# Patient Record
Sex: Male | Born: 1970
Health system: Southern US, Community
[De-identification: ages and names within clinical notes are randomized; demographics above are authoritative.]

## PROBLEM LIST (undated history)

## (undated) DIAGNOSIS — F419 Anxiety disorder, unspecified: Secondary | ICD-10-CM

## (undated) DIAGNOSIS — R112 Nausea with vomiting, unspecified: Secondary | ICD-10-CM

## (undated) DIAGNOSIS — I10 Essential (primary) hypertension: Secondary | ICD-10-CM

## (undated) DIAGNOSIS — Z9889 Other specified postprocedural states: Secondary | ICD-10-CM

## (undated) HISTORY — PX: SHOULDER ARTHROSCOPY: SHX128

## (undated) HISTORY — PX: ELBOW ARTHROSCOPY: SHX614

## (undated) HISTORY — PX: ESOPHAGUS SURGERY: SHX626

---

## 2006-12-08 ENCOUNTER — Ambulatory Visit (HOSPITAL_BASED_OUTPATIENT_CLINIC_OR_DEPARTMENT_OTHER): Admission: RE | Admit: 2006-12-08 | Discharge: 2006-12-08 | Payer: Self-pay | Admitting: Orthopedic Surgery

## 2006-12-09 ENCOUNTER — Emergency Department (HOSPITAL_COMMUNITY): Admission: EM | Admit: 2006-12-09 | Discharge: 2006-12-09 | Payer: Self-pay | Admitting: Emergency Medicine

## 2010-11-13 NOTE — Op Note (Signed)
[name], [name]               ACCOUNT NO.:  [id number]   MEDICAL RECORD NO.:  [id number]          PATIENT TYPE:  AMB   LOCATION:  [facility]                         FACILITY:  [facility]   PHYSICIAN:  Feliberto Gottron. Turner Daniels, M.D.   DATE OF BIRTH:  Apr 08, 1971   DATE OF PROCEDURE:  12/08/2006  DATE OF DISCHARGE:                               OPERATIVE REPORT   PREOPERATIVE DIAGNOSIS:  Right shoulder impingement syndrome with  acromioclavicular joint arthritis.   POSTOPERATIVE DIAGNOSES:  1. Right shoulder impingement syndrome with acromioclavicular joint      arthritis.  2. A loose body in the glenohumeral joint.  3. Mild chondromalacia of the glenoid.   PROCEDURES:  1. Right shoulder arthroscopic anterior-inferior acromioplasty.  2. Formal distal clavicle excision.  3. Debridement of chondromalacia from the anterior and inferior      glenoid.  4. Removal of cartilaginous loose body about 4 mm x 6 mm x 3 mm.   SURGEON:  Feliberto Gottron. Turner Daniels, M.D.   FIRST ASSISTANT:  Skip Mayer PA-C.   ANESTHETIC:  Interscalene block plus general endotracheal.   ESTIMATED BLOOD LOSS:  Minimal.   FLUID REPLACEMENT:  Crystalloid 100 mL.   DRAINS PLACED:  None.   TOURNIQUET TIME:  None.   INDICATIONS FOR PROCEDURE:  This is a 40 year old gentleman who was  treated by one of my partners, Eula Listen, for neck pain and then  right shoulder pain.  He got good temporary relief from a cortisone  injection of the subacromial space.  MRI scan is consistent with  moderate to severe AC joint arthritis as well as shoulder impingement.  Plain radiographs show a 8 mm type 2 subacromial spur.  He has failed  conservative measures and desires elective arthroscopic evaluation and  treatment of his right shoulder.   DESCRIPTION OF PROCEDURE:  The patient was taken to the block area at  the [facility] and underwent right shoulder  interscalene block.  He was then taken to the operating  room.  The  appropriate anesthetic monitors were reattached and general endotracheal  anesthesia induced.  He was then placed in the beach chair position.  The right upper extremity was prepped and draped in the usual sterile  fashion from the wrist to the hemithorax.   Using a #11 blade, standard portals were then made 1.5 cm anterior to  the [facility] joint and lateral to the junction of middle and posterior thirds  acromion and posterior to the posterolateral corner of the acromion  process.  The inflow was placed anteriorly, the arthroscope laterally  and a 4.2 [place] sucker shaver posteriorly, allowing a subacromial  bursectomy and evaluation of the external leaflets of the rotator cuff,  which were intact.  The subacromial spur was then outlined and removed  with a 4.5 hooded vortex bur as was the inferior distal 1 cm of the  clavicle, which had end-stage arthritic changes and bare bone on bare  bone.   We then changed portals, bringing the scope in posteriorly, the inflow  laterally and a 4.5 hooded vortex bur anteriorly, allowing  removal of  the superior one-half of the distal centimeter of clavicle.  Photographic documentation was made of the distal clavicle excision.  The arthroscope was then repositioned into the glenohumeral joint where  we medially identified a 4 x 6 x 3 mm loose body that was removed  through the outflow and found a donor site on the anterior-inferior  glenoid, consistent where the loose body came from.  This was debrided  with a 4.2 [place] sucker shaver back to stable margins and the  labrum was grossly intact.  The articular cartilage of the humeral head  and glenoid were, otherwise, in good condition.  The subscapularis,  supraspinatus and infraspinatus tendon insertions were intact.   At this point, the shoulder was irrigated out with normal saline  solution.  The arthroscopic instruments removed and dressing of  Xeroform, 4x4 dressing sponges,  ABD, paper tape and a sling were  applied.   The patient was then laid supine, awakened and taken to the recovery  room without difficulty.      Feliberto Gottron. Turner Daniels, M.D.  Electronically Signed     FJR/MEDQ  D:  12/08/2006  T:  12/08/2006  Job:  [id number]

## 2011-04-11 ENCOUNTER — Other Ambulatory Visit: Payer: Self-pay | Admitting: Internal Medicine

## 2011-04-11 ENCOUNTER — Ambulatory Visit
Admission: RE | Admit: 2011-04-11 | Discharge: 2011-04-11 | Disposition: A | Discharge status: Home / Self Care | Payer: BC Managed Care – PPO | Source: Ambulatory Visit | Attending: Internal Medicine | Admitting: Internal Medicine

## 2011-04-11 DIAGNOSIS — R52 Pain, unspecified: Secondary | ICD-10-CM

## 2011-04-18 LAB — DIFFERENTIAL
Basophils Relative: 1
Eosinophils Absolute: 0
Lymphs Abs: 2.6
Monocytes Relative: 4
Neutro Abs: 16.5 — ABNORMAL HIGH
Neutrophils Relative %: 83 — ABNORMAL HIGH

## 2011-04-18 LAB — CBC
MCV: 99.6
Platelets: 276
RBC: 4.65
WBC: 20.1 — ABNORMAL HIGH

## 2011-04-18 LAB — POCT HEMOGLOBIN-HEMACUE
Hemoglobin: 15.3
Operator id: 268271

## 2011-04-18 LAB — D-DIMER, QUANTITATIVE: D-Dimer, Quant: <0.22

## 2011-05-13 ENCOUNTER — Other Ambulatory Visit: Payer: Self-pay | Admitting: Orthopedic Surgery

## 2011-05-13 DIAGNOSIS — R531 Weakness: Secondary | ICD-10-CM

## 2011-05-13 DIAGNOSIS — M25521 Pain in right elbow: Secondary | ICD-10-CM

## 2011-05-16 ENCOUNTER — Ambulatory Visit
Admission: RE | Admit: 2011-05-16 | Discharge: 2011-05-16 | Disposition: A | Discharge status: Home / Self Care | Payer: Private Health Insurance - Indemnity | Source: Ambulatory Visit | Attending: Orthopedic Surgery | Admitting: Orthopedic Surgery

## 2011-05-16 DIAGNOSIS — M25521 Pain in right elbow: Secondary | ICD-10-CM

## 2011-05-16 DIAGNOSIS — R531 Weakness: Secondary | ICD-10-CM

## 2012-05-30 ENCOUNTER — Encounter (HOSPITAL_COMMUNITY): Payer: Self-pay | Admitting: Emergency Medicine

## 2012-05-30 ENCOUNTER — Emergency Department (HOSPITAL_COMMUNITY)
Admission: EM | Admit: 2012-05-30 | Discharge: 2012-05-31 | Disposition: A | Discharge status: Home / Self Care | Payer: Self-pay | Attending: Emergency Medicine | Admitting: Emergency Medicine

## 2012-05-30 DIAGNOSIS — Z23 Encounter for immunization: Secondary | ICD-10-CM | POA: Insufficient documentation

## 2012-05-30 DIAGNOSIS — F17200 Nicotine dependence, unspecified, uncomplicated: Secondary | ICD-10-CM | POA: Insufficient documentation

## 2012-05-30 DIAGNOSIS — W268XXA Contact with other sharp object(s), not elsewhere classified, initial encounter: Secondary | ICD-10-CM | POA: Insufficient documentation

## 2012-05-30 DIAGNOSIS — Y9289 Other specified places as the place of occurrence of the external cause: Secondary | ICD-10-CM | POA: Insufficient documentation

## 2012-05-30 DIAGNOSIS — S66829A Laceration of other specified muscles, fascia and tendons at wrist and hand level, unspecified hand, initial encounter: Secondary | ICD-10-CM

## 2012-05-30 DIAGNOSIS — S61409A Unspecified open wound of unspecified hand, initial encounter: Secondary | ICD-10-CM | POA: Insufficient documentation

## 2012-05-30 DIAGNOSIS — Y9389 Activity, other specified: Secondary | ICD-10-CM | POA: Insufficient documentation

## 2012-05-30 DIAGNOSIS — IMO0002 Reserved for concepts with insufficient information to code with codable children: Secondary | ICD-10-CM

## 2012-05-30 DIAGNOSIS — R200 Anesthesia of skin: Secondary | ICD-10-CM

## 2012-05-30 MED ORDER — OXYCODONE-ACETAMINOPHEN 5-325 MG PO TABS
2.0000 | ORAL_TABLET | Freq: Once | ORAL | Status: AC
  Administered 2012-05-30: 2 via ORAL
  Filled 2012-05-30: qty 2

## 2012-05-30 MED ORDER — TETANUS-DIPHTH-ACELL PERTUSSIS 5-2.5-18.5 LF-MCG/0.5 IM SUSP
0.5000 mL | Freq: Once | INTRAMUSCULAR | Status: AC
  Administered 2012-05-30: 0.5 mL via INTRAMUSCULAR
  Filled 2012-05-30: qty 0.5

## 2012-05-30 NOTE — ED Notes (Signed)
Pt. States he cut his wrist and hand on plate glass when he put his hand throw it.

## 2012-05-30 NOTE — ED Notes (Signed)
Suture cart at bedside 

## 2012-05-30 NOTE — ED Notes (Addendum)
Pt. Has a 1 inch laceration to his right hand below his fifth digit. Still has feeling in his finger but limited movement. When removed dressing began actively bleeding. Pressure dressing reapplied. Dressing removed from right wrist not currently bleeding. 1 inch deep laceration noted. Pressure dressing reapplied.

## 2012-05-30 NOTE — ED Provider Notes (Signed)
Medical screening examination/treatment/procedure(s) were conducted as a shared visit with non-physician practitioner(s) and myself.  I personally evaluated the patient during the encounter, right small finger loss 2Pt discrimination ulnar aspect with Pt able to flex FDP/FDS but weak possible flexor tendon(s) partial lac, has CR<2secs.  Hurman Horn, MD 06/01/12 [phone]

## 2012-05-31 MED ORDER — OXYCODONE-ACETAMINOPHEN 5-325 MG PO TABS: 1.0000 | ORAL_TABLET | ORAL | Status: DC | PRN

## 2012-05-31 MED ORDER — CEPHALEXIN 500 MG PO CAPS: 1000.0000 mg | ORAL_CAPSULE | Freq: Two times a day (BID) | ORAL | Status: DC

## 2012-05-31 NOTE — ED Notes (Signed)
Patient sitting at the sink washing his right hand

## 2012-05-31 NOTE — ED Notes (Signed)
Discharge instructions given to patient and family.  Voiced understanding

## 2012-05-31 NOTE — ED Notes (Signed)
Assisted patient to amb to the BR.  Became light headed when.he was getting ready to come back to the room.

## 2012-05-31 NOTE — ED Notes (Signed)
Suturing completed.

## 2012-05-31 NOTE — ED Provider Notes (Signed)
History     CSN: [id number]  Arrival date & time 05/30/12  2218   First MD Initiated Contact with Patient 05/30/12 2325      Chief Complaint  Patient presents with  . Extremity Laceration    (Consider location/radiation/quality/duration/timing/severity/associated sxs/prior treatment) The history is provided by the patient and medical records.   SUBJECTIVE:  41 y.o. male sustained laceration of hand and wrist 2 hours ago. Ashby Dawes of injury: patient was trying to catch a closing door when his hand punched through the glass pabe. Tetanus vaccination status reviewed: tetanus status unknown to the patient.  Symptoms began acutely, have been persistent and stabilized.  It is associated swelling, bleeding pain at the site.  Nothing makes the pain better, palpation makes the pain worse.  Patient denies fever, chills, headache, chest pain, shortness of breath, abdominal pain, nausea, vomiting, diarrhea, weakness, dizziness, numbness, tingling.  Patient states he has feeling in his hand except for his pinky finger and he is able to move it.  Patient endorses significant amount of EtOH today while watching football.  He denies falling, altered consciousness or hitting his head.     History reviewed. No pertinent past medical history.  Past Surgical History  Procedure Date  . Shoulder arthroscopy     right  . Elbow arthroscopy     right    No family history on file.  History  Substance Use Topics  . Smoking status: Current Every Day Smoker -- 1.0 packs/day    Types: Cigarettes  . Smokeless tobacco: Not on file  . Alcohol Use: Yes     Comment: weekends      Review of Systems  Constitutional: Negative for fever.  Skin: Positive for wound.  Neurological: Negative for weakness and numbness.  All other systems reviewed and are negative.    Allergies  Morphine and related  Home Medications   Current Outpatient Rx  Name  Route  Sig  Dispense  Refill  . IBUPROFEN 200 MG PO  TABS   Oral   Take 400 mg by mouth every 6 (six) hours as needed. For pain         . CEPHALEXIN 500 MG PO CAPS   Oral   Take 2 capsules (1,000 mg total) by mouth 2 (two) times daily.   20 capsule   0   . OXYCODONE-ACETAMINOPHEN 5-325 MG PO TABS   Oral   Take 1 tablet by mouth every 4 (four) hours as needed for pain.   20 tablet   0     BP 146/78  Pulse 101  Temp 97.7 F (36.5 C) (Oral)  Resp 16  SpO2 98%  Physical Exam  Nursing note and vitals reviewed. Constitutional: He appears well-developed and well-nourished. No distress.  HENT:  Head: Normocephalic and atraumatic.  Eyes: Conjunctivae normal are normal. No scleral icterus.  Cardiovascular: Normal rate, regular rhythm, normal heart sounds and intact distal pulses.  Exam reveals no gallop and no friction rub.   No murmur heard.      Capillary refill < 2 sec  Pulmonary/Chest: Effort normal and breath sounds normal. No respiratory distress. He has no wheezes. He has no rales.  Musculoskeletal: Normal range of motion. He exhibits no edema.       Right wrist: He exhibits laceration.       Arms:      Right hand: He exhibits laceration. decreased sensation noted. Decreased sensation is present in the ulnar distribution.  Hands:      Weak flexion of the little finger, extension of the finger intact Flexion/extension of the flexor and extensor tendons in the rest of the hand  Neurological: He is alert. GCS eye subscore is 4. GCS verbal subscore is 5. GCS motor subscore is 6.       right small finger with loss of two point discrimination along the ulnar aspect; two point discrimination present in all other fingers and in the radial aspect of the right small finger Right hand able to flex fingers but with weak flexion  Visible injury to the flexor tendon sheath of the right small finger  Skin: Skin is warm and dry. He is not diaphoretic.    ED Course  Procedures (including critical care time)  Labs Reviewed - No  data to display No results found.  LACERATION REPAIR Performed by: Dierdre Forth Authorized by: Dierdre Forth Consent: Verbal consent obtained. Risks and benefits: risks, benefits and alternatives were discussed Consent given by: patient Patient identity confirmed: provided demographic data Prepped and Draped in normal sterile fashion Wound explored  Laceration Location: r hand - laceration to the ulnar aspect of the palm, base of the small finger and base of the ring finger  Laceration Length: 2cm, 4cm and 2cm respectively  Paint flecks seen in the would  Anesthesia: local infiltration  Local anesthetic: lidocaine 2% without epinephrine  Anesthetic total: 10 ml  Irrigation method: syringe Amount of cleaning: >1 liter of fluid and manual foreign body removal  Skin closure: 4-0 prolene  Number of sutures: 15  Technique: simple interrupted  Patient tolerance: Patient tolerated the procedure well with no immediate complications.  LACERATION REPAIR Performed by: Dierdre Forth Authorized by: Dierdre Forth Consent: Verbal consent obtained. Risks and benefits: risks, benefits and alternatives were discussed Consent given by: patient Patient identity confirmed: provided demographic data Prepped and Draped in normal sterile fashion Wound explored  Laceration Location: right wrist  Laceration Length: 5 cm  No Foreign Bodies seen or palpated  Anesthesia: local infiltration  Local anesthetic: lidocaine 2 % without epinephrine  Anesthetic total: 10 ml  Irrigation method: syringe Amount of cleaning: standard  Skin closure: 3-0 prolene  Number of sutures: 8  Technique: interrupted mattress sutures  Patient tolerance: Patient tolerated the procedure well with no immediate complications.   1. Laceration   2. Flexor tendon laceration, hand, open wound   3. Loss of sensation of skin       MDM  [name] presents with  laceration to the R hand and wrist.  Tdap booster given.Pressure irrigation performed. Laceration occurred < 8 hours prior to repair which was well tolerated. Pt has no co morbidities to effect normal wound healing. Discussed suture home care w pt and answered questions. Pt to follow up with hand surgery for wound check and suture removal in 7 days. Pt is hemodynamically stable with no complaints prior to dc.  Hand and wrist splinted to prevent damage to the sutures.    1. Medications: percocet, keflex, usual home medications 2. Treatment: rest, drink plenty of fluids, take medications as prescribed, keep wound clean and dry as discussed 3. Follow Up: Please followup with your primary doctor for discussion of your diagnoses and further evaluation after today's visit; if you do not have a primary care doctor use the resource guide provided to find one; also follow-up with the hand surgeon          Dierdre Forth, PA-C 05/31/12 [id number]

## 2012-06-01 ENCOUNTER — Encounter (HOSPITAL_COMMUNITY): Payer: Self-pay | Admitting: *Deleted

## 2012-06-01 ENCOUNTER — Encounter (HOSPITAL_COMMUNITY): Payer: Self-pay | Admitting: Pharmacy Technician

## 2012-06-02 ENCOUNTER — Encounter (HOSPITAL_COMMUNITY): Payer: Self-pay | Admitting: Certified Registered Nurse Anesthetist

## 2012-06-02 ENCOUNTER — Ambulatory Visit (HOSPITAL_COMMUNITY)
Admission: RE | Admit: 2012-06-02 | Discharge: 2012-06-02 | Disposition: A | Discharge status: Home / Self Care | Payer: Self-pay | Source: Ambulatory Visit | Attending: General Surgery | Admitting: General Surgery

## 2012-06-02 ENCOUNTER — Encounter (HOSPITAL_COMMUNITY)
Admission: RE | Disposition: A | Discharge status: Home / Self Care | Payer: Self-pay | Source: Ambulatory Visit | Attending: General Surgery

## 2012-06-02 ENCOUNTER — Encounter (HOSPITAL_COMMUNITY): Payer: Self-pay | Admitting: *Deleted

## 2012-06-02 ENCOUNTER — Ambulatory Visit (HOSPITAL_COMMUNITY): Payer: Self-pay | Admitting: Certified Registered Nurse Anesthetist

## 2012-06-02 DIAGNOSIS — F17200 Nicotine dependence, unspecified, uncomplicated: Secondary | ICD-10-CM | POA: Insufficient documentation

## 2012-06-02 DIAGNOSIS — IMO0002 Reserved for concepts with insufficient information to code with codable children: Secondary | ICD-10-CM | POA: Insufficient documentation

## 2012-06-02 DIAGNOSIS — S61209A Unspecified open wound of unspecified finger without damage to nail, initial encounter: Secondary | ICD-10-CM | POA: Insufficient documentation

## 2012-06-02 DIAGNOSIS — W268XXA Contact with other sharp object(s), not elsewhere classified, initial encounter: Secondary | ICD-10-CM | POA: Insufficient documentation

## 2012-06-02 HISTORY — PX: TENDON REPAIR: SHX5111

## 2012-06-02 HISTORY — DX: Other specified postprocedural states: Z98.890

## 2012-06-02 HISTORY — DX: Other specified postprocedural states: R11.2

## 2012-06-02 LAB — COMPREHENSIVE METABOLIC PANEL
ALT: 42 U/L (ref 0–53)
Alkaline Phosphatase: 76 U/L (ref 39–117)
BUN: 12 mg/dL (ref 6–23)
CO2: 25 mEq/L (ref 19–32)
Calcium: 9.1 mg/dL (ref 8.4–10.5)
GFR calc Af Amer: >90 mL/min (ref >90)
GFR calc non Af Amer: >90 mL/min (ref >90)
Glucose, Bld: 91 mg/dL (ref 70–99)
Sodium: 136 mEq/L (ref 135–145)

## 2012-06-02 LAB — CBC
HCT: 45.4 % (ref 39.0–52.0)
Platelets: 247 10*3/uL (ref 150–400)
RBC: 4.74 MIL/uL (ref 4.22–5.81)
RDW: 12.3 % (ref 11.5–15.5)
WBC: 11.1 10*3/uL — ABNORMAL HIGH (ref 4.0–10.5)

## 2012-06-02 LAB — SURGICAL PCR SCREEN: Staphylococcus aureus: NEGATIVE

## 2012-06-02 SURGERY — TENDON REPAIR
Anesthesia: General | Site: Finger | Laterality: Right | Wound class: Clean

## 2012-06-02 MED ORDER — CEFAZOLIN SODIUM-DEXTROSE 2-3 GM-% IV SOLR
INTRAVENOUS | Status: AC
  Administered 2012-06-02: 2 g via INTRAVENOUS
  Filled 2012-06-02: qty 50

## 2012-06-02 MED ORDER — KETOROLAC TROMETHAMINE 30 MG/ML IJ SOLN
INTRAMUSCULAR | Status: AC
  Filled 2012-06-02: qty 1

## 2012-06-02 MED ORDER — PROPOFOL 10 MG/ML IV BOLUS
INTRAVENOUS | Status: DC | PRN
  Administered 2012-06-02: 200 mg via INTRAVENOUS

## 2012-06-02 MED ORDER — SUFENTANIL CITRATE 50 MCG/ML IV SOLN
INTRAVENOUS | Status: DC | PRN
  Administered 2012-06-02 (×2): 10 ug via INTRAVENOUS

## 2012-06-02 MED ORDER — BUPIVACAINE HCL (PF) 0.25 % IJ SOLN
INTRAMUSCULAR | Status: DC | PRN
  Administered 2012-06-02: 15 mL

## 2012-06-02 MED ORDER — MUPIROCIN 2 % EX OINT
TOPICAL_OINTMENT | Freq: Two times a day (BID) | CUTANEOUS | Status: DC
  Filled 2012-06-02: qty 22

## 2012-06-02 MED ORDER — FENTANYL CITRATE 0.05 MG/ML IJ SOLN
INTRAMUSCULAR | Status: AC
  Filled 2012-06-02: qty 2

## 2012-06-02 MED ORDER — SODIUM CHLORIDE 0.9 % IR SOLN
Status: DC | PRN
  Administered 2012-06-02: 1000 mL

## 2012-06-02 MED ORDER — MUPIROCIN 2 % EX OINT
TOPICAL_OINTMENT | CUTANEOUS | Status: AC
  Filled 2012-06-02: qty 22

## 2012-06-02 MED ORDER — DEXAMETHASONE SODIUM PHOSPHATE 4 MG/ML IJ SOLN
INTRAMUSCULAR | Status: DC | PRN
  Administered 2012-06-02: 4 mg via INTRAVENOUS

## 2012-06-02 MED ORDER — ONDANSETRON HCL 4 MG/2ML IJ SOLN
INTRAMUSCULAR | Status: DC | PRN
  Administered 2012-06-02: 4 mg via INTRAVENOUS

## 2012-06-02 MED ORDER — PROMETHAZINE HCL 25 MG/ML IJ SOLN: 6.2500 mg | INTRAMUSCULAR | Status: DC | PRN

## 2012-06-02 MED ORDER — MEPERIDINE HCL 25 MG/ML IJ SOLN: 6.2500 mg | INTRAMUSCULAR | Status: DC | PRN

## 2012-06-02 MED ORDER — FENTANYL CITRATE 0.05 MG/ML IJ SOLN
25.0000 ug | INTRAMUSCULAR | Status: DC | PRN
  Administered 2012-06-02: 50 ug via INTRAVENOUS

## 2012-06-02 MED ORDER — LACTATED RINGERS IV SOLN
INTRAVENOUS | Status: DC
  Administered 2012-06-02: 14:00:00 via INTRAVENOUS

## 2012-06-02 MED ORDER — BUPIVACAINE HCL (PF) 0.25 % IJ SOLN
INTRAMUSCULAR | Status: AC
  Filled 2012-06-02: qty 30

## 2012-06-02 MED ORDER — LACTATED RINGERS IV SOLN
INTRAVENOUS | Status: DC | PRN
  Administered 2012-06-02: 14:00:00 via INTRAVENOUS

## 2012-06-02 MED ORDER — CEFAZOLIN SODIUM-DEXTROSE 2-3 GM-% IV SOLR: 2.0000 g | INTRAVENOUS | Status: DC

## 2012-06-02 MED ORDER — MUPIROCIN 2 % EX OINT
TOPICAL_OINTMENT | CUTANEOUS | Status: AC
  Administered 2012-06-02: 1
  Filled 2012-06-02: qty 22

## 2012-06-02 MED ORDER — KETOROLAC TROMETHAMINE 30 MG/ML IJ SOLN
15.0000 mg | Freq: Once | INTRAMUSCULAR | Status: AC | PRN
  Administered 2012-06-02: 30 mg via INTRAVENOUS

## 2012-06-02 MED ORDER — SCOPOLAMINE 1 MG/3DAYS TD PT72
MEDICATED_PATCH | TRANSDERMAL | Status: AC
  Filled 2012-06-02: qty 1

## 2012-06-02 MED ORDER — CEFAZOLIN SODIUM-DEXTROSE 2-3 GM-% IV SOLR
INTRAVENOUS | Status: AC
  Filled 2012-06-02: qty 50

## 2012-06-02 MED ORDER — SCOPOLAMINE 1 MG/3DAYS TD PT72
1.0000 | MEDICATED_PATCH | Freq: Once | TRANSDERMAL | Status: DC
  Administered 2012-06-02: 1.5 mg via TRANSDERMAL

## 2012-06-02 MED ORDER — MIDAZOLAM HCL 5 MG/5ML IJ SOLN
INTRAMUSCULAR | Status: DC | PRN
  Administered 2012-06-02: 2 mg via INTRAVENOUS

## 2012-06-02 MED ORDER — MIDAZOLAM HCL 2 MG/2ML IJ SOLN: 0.5000 mg | Freq: Once | INTRAMUSCULAR | Status: DC | PRN

## 2012-06-02 MED ORDER — LIDOCAINE HCL (CARDIAC) 20 MG/ML IV SOLN
INTRAVENOUS | Status: DC | PRN
  Administered 2012-06-02: 40 mg via INTRAVENOUS
  Administered 2012-06-02: 60 mg via INTRAVENOUS

## 2012-06-02 SURGICAL SUPPLY — 48 items
BANDAGE ELASTIC 3 VELCRO ST LF (GAUZE/BANDAGES/DRESSINGS) IMPLANT
BANDAGE ELASTIC 4 VELCRO ST LF (GAUZE/BANDAGES/DRESSINGS) IMPLANT
BANDAGE GAUZE ELAST BULKY 4 IN (GAUZE/BANDAGES/DRESSINGS) IMPLANT
BNDG COHESIVE 1X5 TAN STRL LF (GAUZE/BANDAGES/DRESSINGS) IMPLANT
BNDG ELASTIC 2 VLCR STRL LF (GAUZE/BANDAGES/DRESSINGS) ×2 IMPLANT
CLOTH BEACON ORANGE TIMEOUT ST (SAFETY) ×2 IMPLANT
CONNECTOR NERVE AXOGARD 1.5X10 (Orthopedic Implant) ×2 IMPLANT
CORDS BIPOLAR (ELECTRODE) ×2 IMPLANT
COVER SURGICAL LIGHT HANDLE (MISCELLANEOUS) ×2 IMPLANT
CUFF TOURNIQUET SINGLE 18IN (TOURNIQUET CUFF) ×2 IMPLANT
CUFF TOURNIQUET SINGLE 24IN (TOURNIQUET CUFF) IMPLANT
DECANTER SPIKE VIAL GLASS SM (MISCELLANEOUS) ×2 IMPLANT
DRAPE OEC MINIVIEW 54X84 (DRAPES) IMPLANT
DRAPE SURG 17X23 STRL (DRAPES) ×2 IMPLANT
GAUZE SPONGE 2X2 8PLY STRL LF (GAUZE/BANDAGES/DRESSINGS) IMPLANT
GAUZE XEROFORM 1X8 LF (GAUZE/BANDAGES/DRESSINGS) ×2 IMPLANT
GLOVE BIOGEL M STRL SZ7.5 (GLOVE) ×2 IMPLANT
GOWN PREVENTION PLUS XLARGE (GOWN DISPOSABLE) ×2 IMPLANT
GOWN STRL NON-REIN LRG LVL3 (GOWN DISPOSABLE) ×4 IMPLANT
KIT BASIN OR (CUSTOM PROCEDURE TRAY) ×2 IMPLANT
KIT ROOM TURNOVER OR (KITS) ×2 IMPLANT
MANIFOLD NEPTUNE II (INSTRUMENTS) IMPLANT
NEEDLE HYPO 25GX1X1/2 BEV (NEEDLE) ×2 IMPLANT
NS IRRIG 1000ML POUR BTL (IV SOLUTION) ×2 IMPLANT
PACK ORTHO EXTREMITY (CUSTOM PROCEDURE TRAY) ×2 IMPLANT
PAD ARMBOARD 7.5X6 YLW CONV (MISCELLANEOUS) ×4 IMPLANT
PAD CAST 3X4 CTTN HI CHSV (CAST SUPPLIES) ×1 IMPLANT
PAD CAST 4YDX4 CTTN HI CHSV (CAST SUPPLIES) IMPLANT
PADDING CAST COTTON 3X4 STRL (CAST SUPPLIES) ×1
PADDING CAST COTTON 4X4 STRL (CAST SUPPLIES)
SOAP 2 % CHG 4 OZ (WOUND CARE) ×2 IMPLANT
SPECIMEN JAR SMALL (MISCELLANEOUS) ×2 IMPLANT
SPLINT FIBERGLASS 3X35 (CAST SUPPLIES) ×2 IMPLANT
SPONGE GAUZE 2X2 STER 10/PKG (GAUZE/BANDAGES/DRESSINGS)
SPONGE GAUZE 4X4 12PLY (GAUZE/BANDAGES/DRESSINGS) ×2 IMPLANT
SPONGE SCRUB IODOPHOR (GAUZE/BANDAGES/DRESSINGS) ×2 IMPLANT
SUCTION FRAZIER TIP 10 FR DISP (SUCTIONS) ×2 IMPLANT
SUT ETHILON 5 0 PS 2 18 (SUTURE) ×2 IMPLANT
SUT MERSILENE 4 0 P 3 (SUTURE) IMPLANT
SUT PROLENE 4 0 PS 2 18 (SUTURE) IMPLANT
SUT VIC AB 2-0 CT1 27 (SUTURE)
SUT VIC AB 2-0 CT1 TAPERPNT 27 (SUTURE) IMPLANT
SYR CONTROL 10ML LL (SYRINGE) ×2 IMPLANT
TOWEL OR 17X24 6PK STRL BLUE (TOWEL DISPOSABLE) ×2 IMPLANT
TOWEL OR 17X26 10 PK STRL BLUE (TOWEL DISPOSABLE) ×2 IMPLANT
TUBE CONNECTING 12X1/4 (SUCTIONS) ×2 IMPLANT
UNDERPAD 30X30 INCONTINENT (UNDERPADS AND DIAPERS) ×2 IMPLANT
WATER STERILE IRR 1000ML POUR (IV SOLUTION) IMPLANT

## 2012-06-02 NOTE — Transfer of Care (Signed)
Immediate Anesthesia Transfer of Care Note  Patient: [name]  Procedure(s) Performed: Procedure(s) (LRB) with comments: TENDON REPAIR (Right) - Nerve repair right pinky finger  Patient Location: PACU  Anesthesia Type:General  Level of Consciousness: awake, alert  and oriented  Airway & Oxygen Therapy: Patient Spontanous Breathing and Patient connected to nasal cannula oxygen  Post-op Assessment: Report given to PACU RN, Post -op Vital signs reviewed and stable and Patient moving all extremities  Post vital signs: Reviewed and stable  Complications: No apparent anesthesia complications

## 2012-06-02 NOTE — H&P (Signed)
S:pt here for operative repair of RSF, denies any new complaints/problems since he was evaluated in the office yesterday.  O:Blood pressure 148/87, pulse 101, temperature 97.8 F (36.6 C), temperature source Oral, resp. rate 20, height 6\' 4"  (1.93 m), weight 103.3 kg (227 lb 11.8 oz), SpO2 98.00%. No results found for this or any previous visit.   A:RSF complex laceration with probable digital nerve laceration   P:pt seen and examined, all questions answered, will proceed with surgery as planned.

## 2012-06-02 NOTE — Anesthesia Preprocedure Evaluation (Addendum)
Anesthesia Evaluation  Patient identified by MRN, date of birth, ID band Patient awake    Reviewed: Allergy & Precautions, H&P , Patient's Chart, lab work & pertinent test results, reviewed documented beta blocker date and time   History of Anesthesia Complications (+) PONV  Airway Mallampati: II TM Distance: >3 FB Neck ROM: full    Dental No notable dental hx. (+) Dental Advisory Given   Pulmonary neg pulmonary ROS, Current Smoker,  breath sounds clear to auscultation  Pulmonary exam normal       Cardiovascular Exercise Tolerance: Good negative cardio ROS  Rhythm:regular Rate:Normal     Neuro/Psych negative neurological ROS  negative psych ROS   GI/Hepatic negative GI ROS, Neg liver ROS,   Endo/Other  negative endocrine ROS  Renal/GU negative Renal ROS     Musculoskeletal   Abdominal   Peds  Hematology negative hematology ROS (+)   Anesthesia Other Findings   Reproductive/Obstetrics negative OB ROS                          Anesthesia Physical Anesthesia Plan  ASA: II  Anesthesia Plan: General LMA   Post-op Pain Management:    Induction: Intravenous  Airway Management Planned: LMA  Additional Equipment:   Intra-op Plan:   Post-operative Plan:   Informed Consent: I have reviewed the patients History and Physical, chart, labs and discussed the procedure including the risks, benefits and alternatives for the proposed anesthesia with the patient or authorized representative who has indicated his/her understanding and acceptance.   Dental Advisory Given and Dental advisory given  Plan Discussed with: CRNA, Surgeon and Anesthesiologist  Anesthesia Plan Comments:        Anesthesia Quick Evaluation

## 2012-06-02 NOTE — Progress Notes (Signed)
Call to Dr. Debby Bud office, clarified that [facility] stands for R small finger.

## 2012-06-02 NOTE — Anesthesia Postprocedure Evaluation (Signed)
Anesthesia Post Note  Patient: [name]  Procedure(s) Performed: Procedure(s) (LRB): TENDON REPAIR (Right)  Anesthesia type: GA  Patient location: PACU  Post pain: Pain level controlled  Post assessment: Post-op Vital signs reviewed  Last Vitals:  Filed Vitals:   06/02/12 1609  BP:   Pulse: 98  Temp:   Resp: 16    Post vital signs: Reviewed  Level of consciousness: sedated  Complications: No apparent anesthesia complications

## 2012-06-02 NOTE — Preoperative (Signed)
Beta Blockers   Reason not to administer Beta Blockers:Not Applicable 

## 2012-06-03 ENCOUNTER — Encounter (HOSPITAL_COMMUNITY): Payer: Self-pay | Admitting: General Surgery

## 2012-06-03 NOTE — Op Note (Signed)
[name], KREITZER NO.:  [id number]  MEDICAL RECORD NO.:  [id number]  LOCATION:  [facility]                         FACILITY:  [facility]  PHYSICIAN:  Johnette Abraham, MD    DATE OF BIRTH:  June 16, 1971  DATE OF PROCEDURE:  06/02/2012 DATE OF DISCHARGE:  06/02/2012                              OPERATIVE REPORT   PREOPERATIVE DIAGNOSIS:  Complex laceration to the right hand, right small finger with probable ulnar digital nerve laceration.  POSTOPERATIVE DIAGNOSIS:  Complex laceration to the right hand, right small finger with probable ulnar digital nerve laceration.  PROCEDURE:  Exploration of wound to the right small finger.  Repair of ulnar digital nerve, primary repair with a 2 mm x 10 mm nerve wrap.  ANESTHESIA:  General.  POSTOPERATIVE CONDITION:  Stable.  ESTIMATED BLOOD LOSS:  Minimal.  TOURNIQUET TIME:  Approximately 30 minutes.  INDICATIONS:  Mr. Savastano is a 41 year old gentleman who injured his hand on a plate glass this weekend and he presented to my office with signs and symptoms of ulnar digital nerve laceration.  He had multiple other lacerations about his ring finger and his wrist, however, neurovascularly he was intact with the exception of his small finger. Risks and benefits of alternatives of surgical repair of the nerve were thoroughly discussed with him.  He agreed with these risks and agreed to proceed with surgery.  Consent was obtained.  PROCEDURE:  The patient was taken to the operating room, placed supine on the operating room table.  Time-out was performed.  All extremities were padded.  Preoperative antibiotics were given.  General anesthesia was administered without difficulty.  The right upper extremity was prepped and draped in normal sterile fashion.  The arm was exsanguinated, Esmarch was used, tourniquet was inflated to 250 mmHg initially.  The sutures in the wound at the base of the right small finger were removed.  The wound  was then thoroughly irrigated with a liter of saline solution.  The incision had to be lengthened most proximally and distally to gain exposure.  The wound was explored. There was a laceration through the A2 pulley tendon sheath, however, both the FDP and FDS tendons were intact.  The radial neurovascular bundles were examined and intact.  With magnification, the ulnar digital nerve was isolated both proximally and distally and cleaned and freed. Both ends of the nerve were trimmed sharply with straight scissors.  A small piece of Esmarch was used underneath the nerve to gain adequate visualization of the epineurium.  The nerve was repaired in an epineural fashion with total of four 9-0 nylon sutures nicely approximating the nerve.  Following the nerve wrap that had been soaking in saline was cut in the middle and placed over the nerve.  It fit nicely and therefore no sutures were used to hold the wrap in place.  Afterwards the tourniquet was released.  Hemostasis was controlled with direct pressure and bipolar.  The skin edges were closed with multiple interrupted 5-0 nylon sutures.  More proximally there was a wound.  There was some concern for infection and it appeared that some kind of medical superglue had been placed over this abrasion type  puncture wound.  The superglue was removed.  The wound was debrided and then all wounds were covered with Xeroform ointment, sterile dressing, and a ulnar gutter splint was placed.  The patient tolerated the procedure well and was taken to recovery in stable condition.     Johnette Abraham, MD     HCC/MEDQ  D:  06/02/2012  T:  06/03/2012  Job:  [id number]

## 2014-11-13 ENCOUNTER — Encounter (HOSPITAL_COMMUNITY): Payer: Self-pay | Admitting: *Deleted

## 2014-11-13 ENCOUNTER — Emergency Department (HOSPITAL_COMMUNITY)
Admission: EM | Admit: 2014-11-13 | Discharge: 2014-11-13 | Disposition: A | Discharge status: Home / Self Care | Payer: Self-pay | Source: Home / Self Care | Attending: Family Medicine | Admitting: Family Medicine

## 2014-11-13 DIAGNOSIS — H6982 Other specified disorders of Eustachian tube, left ear: Secondary | ICD-10-CM

## 2014-11-13 MED ORDER — TRIAMCINOLONE ACETONIDE 40 MG/ML IJ SUSP
40.0000 mg | Freq: Once | INTRAMUSCULAR | Status: AC
  Administered 2014-11-13: 40 mg via INTRAMUSCULAR

## 2014-11-13 MED ORDER — IPRATROPIUM BROMIDE 0.06 % NA SOLN: 2.0000 | Freq: Four times a day (QID) | NASAL | Status: DC

## 2014-11-13 MED ORDER — TRIAMCINOLONE ACETONIDE 40 MG/ML IJ SUSP
INTRAMUSCULAR | Status: AC
  Filled 2014-11-13: qty 1

## 2014-11-13 NOTE — ED Notes (Signed)
Pt  Has  Symptoms of  Sinus  Drainage  /   Congestion        l earache  As  Well    Pt  Reports    symptoms  Not  releived  By otc meds  /   remedys          Pt  Ambulated  To  Room  With s   Steady  Fluid gait      Sitting upright on  The  Exam table  Speaking  In   complte  sentances

## 2014-11-13 NOTE — ED Provider Notes (Signed)
CSN: [id number]     Arrival date & time 11/13/14  1119 History   First MD Initiated Contact with Patient 11/13/14 1255     Chief Complaint  Patient presents with  . Otalgia   (Consider location/radiation/quality/duration/timing/severity/associated sxs/prior Treatment) Patient is a 44 y.o. male presenting with ear pain. The history is provided by the patient.  Otalgia Location:  Left Behind ear:  No abnormality Quality:  Sharp and pressure Severity:  No pain Onset quality:  Sudden Duration:  1 day Progression:  Unchanged Chronicity:  New Context comment:  Uri sx. Relieved by:  None tried Worsened by:  Nothing tried Ineffective treatments:  None tried Associated symptoms: congestion and rhinorrhea   Associated symptoms: no ear discharge, no fever and no headaches     Past Medical History  Diagnosis Date  . PONV (postoperative nausea and vomiting)    Past Surgical History  Procedure Laterality Date  . Shoulder arthroscopy      right  . Elbow arthroscopy      right  . Tendon repair  06/02/2012    Procedure: TENDON REPAIR;  Surgeon: Johnette AbrahamHarrill C Coley, MD;  Location: [facility] OR;  Service: Plastics;  Laterality: Right;  Nerve repair right pinky finger   History reviewed. No pertinent family history. History  Substance Use Topics  . Smoking status: Current Every Day Smoker -- 1.00 packs/day    Types: Cigarettes  . Smokeless tobacco: Not on file  . Alcohol Use: 7.2 oz/week    12 Cans of beer per week     Comment: weekends    Review of Systems  Constitutional: Negative.  Negative for fever.  HENT: Positive for congestion, ear pain and rhinorrhea. Negative for ear discharge.   Eyes: Negative.   Respiratory: Negative.   Neurological: Negative for headaches.    Allergies  Morphine and related  Home Medications   Prior to Admission medications   Medication Sig Start Date End Date Taking? Authorizing Provider  cephALEXin (KEFLEX) 500 MG capsule Take 2 capsules (1,000 mg  total) by mouth 2 (two) times daily. 05/31/12   Hannah Muthersbaugh, PA-C  ibuprofen (ADVIL,MOTRIN) 200 MG tablet Take 400 mg by mouth every 6 (six) hours as needed. For pain    Historical Provider, MD  ipratropium (ATROVENT) 0.06 % nasal spray Place 2 sprays into both nostrils 4 (four) times daily. 11/13/14   Linna HoffJames D Cornelious Bartolucci, MD  oxyCODONE-acetaminophen (PERCOCET) 5-325 MG per tablet Take 1 tablet by mouth every 4 (four) hours as needed for pain. 05/31/12   Hannah Muthersbaugh, PA-C   BP 127/84 mmHg  Pulse 71  Temp(Src) 97.5 F (36.4 C) (Oral)  Resp 16  SpO2 99% Physical Exam  Constitutional: He is oriented to person, place, and time. He appears well-developed and well-nourished.  HENT:  Head: Normocephalic.  Right Ear: External ear normal.  Left Ear: External ear normal.  Mouth/Throat: Oropharynx is clear and moist.  Eyes: Conjunctivae are normal. Pupils are equal, round, and reactive to light.  Neck: Normal range of motion. Neck supple.  Lymphadenopathy:    He has no cervical adenopathy.  Neurological: He is alert and oriented to person, place, and time.  Skin: Skin is warm and dry.  Nursing note and vitals reviewed.   ED Course  Procedures (including critical care time) Labs Review Labs Reviewed - No data to display  Imaging Review No results found.   MDM   1. ETD (eustachian tube dysfunction), left        Linna HoffJames D Desera Graffeo,  MD 11/13/14 1304

## 2014-11-13 NOTE — Discharge Instructions (Signed)
Use mucinex-d and spray as prescribed , esp before flying. Return as needed.

## 2015-01-03 ENCOUNTER — Emergency Department (HOSPITAL_COMMUNITY): Payer: Private Health Insurance - Indemnity

## 2015-01-03 ENCOUNTER — Emergency Department (HOSPITAL_COMMUNITY)
Admission: EM | Admit: 2015-01-03 | Discharge: 2015-01-03 | Disposition: A | Discharge status: Home / Self Care | Payer: Private Health Insurance - Indemnity | Attending: Emergency Medicine | Admitting: Emergency Medicine

## 2015-01-03 ENCOUNTER — Encounter (HOSPITAL_COMMUNITY): Payer: Self-pay

## 2015-01-03 DIAGNOSIS — Z720 Tobacco use: Secondary | ICD-10-CM | POA: Insufficient documentation

## 2015-01-03 DIAGNOSIS — K6289 Other specified diseases of anus and rectum: Secondary | ICD-10-CM | POA: Insufficient documentation

## 2015-01-03 LAB — BASIC METABOLIC PANEL
Anion gap: 7 (ref 5–15)
BUN: 14 mg/dL (ref 6–20)
CHLORIDE: 104 mmol/L (ref 101–111)
CO2: 27 mmol/L (ref 22–32)
Calcium: 8.8 mg/dL — ABNORMAL LOW (ref 8.9–10.3)
Creatinine, Ser: 0.9 mg/dL (ref 0.61–1.24)
GFR calc non Af Amer: >60 mL/min (ref >60)
GLUCOSE: 90 mg/dL (ref 65–99)
POTASSIUM: 4 mmol/L (ref 3.5–5.1)
Sodium: 138 mmol/L (ref 135–145)

## 2015-01-03 LAB — CBC WITH DIFFERENTIAL/PLATELET
BASOS ABS: 0 10*3/uL (ref 0.0–0.1)
BASOS PCT: 0 % (ref 0–1)
Eosinophils Absolute: 0 10*3/uL (ref 0.0–0.7)
Eosinophils Relative: 0 % (ref 0–5)
HCT: 45.9 % (ref 39.0–52.0)
Hemoglobin: 15.8 g/dL (ref 13.0–17.0)
LYMPHS PCT: 22 % (ref 12–46)
Lymphs Abs: 2.7 10*3/uL (ref 0.7–4.0)
MCH: 34.6 pg — ABNORMAL HIGH (ref 26.0–34.0)
MCHC: 34.4 g/dL (ref 30.0–36.0)
MCV: 100.4 fL — ABNORMAL HIGH (ref 78.0–100.0)
Monocytes Absolute: 0.8 10*3/uL (ref 0.1–1.0)
Monocytes Relative: 6 % (ref 3–12)
NEUTROS ABS: 8.6 10*3/uL — AB (ref 1.7–7.7)
NEUTROS PCT: 72 % (ref 43–77)
PLATELETS: 282 10*3/uL (ref 150–400)
RBC: 4.57 MIL/uL (ref 4.22–5.81)
RDW: 13.2 % (ref 11.5–15.5)
WBC: 12.1 10*3/uL — AB (ref 4.0–10.5)

## 2015-01-03 LAB — POC OCCULT BLOOD, ED: FECAL OCCULT BLD: NEGATIVE

## 2015-01-03 MED ORDER — OXYCODONE-ACETAMINOPHEN 5-325 MG PO TABS: 2.0000 | ORAL_TABLET | ORAL | Status: DC | PRN

## 2015-01-03 MED ORDER — IOHEXOL 300 MG/ML  SOLN
100.0000 mL | Freq: Once | INTRAMUSCULAR | Status: AC | PRN
  Administered 2015-01-03: 100 mL via INTRAVENOUS

## 2015-01-03 MED ORDER — CEPHALEXIN 500 MG PO CAPS: 500.0000 mg | ORAL_CAPSULE | Freq: Four times a day (QID) | ORAL | Status: DC

## 2015-01-03 MED ORDER — FENTANYL CITRATE (PF) 100 MCG/2ML IJ SOLN
25.0000 ug | Freq: Once | INTRAMUSCULAR | Status: AC
  Administered 2015-01-03: 25 ug via INTRAVENOUS
  Filled 2015-01-03: qty 2

## 2015-01-03 MED ORDER — LIDOCAINE HCL 2 % EX GEL: 1.0000 "application " | CUTANEOUS | Status: DC | PRN

## 2015-01-03 NOTE — ED Notes (Addendum)
Patient transported to CT 

## 2015-01-03 NOTE — ED Notes (Signed)
RN AT BEDSIDE FOR BLOOD 

## 2015-01-03 NOTE — ED Provider Notes (Signed)
CSN: [id number]     Arrival date & time 01/03/15  1349 History   First MD Initiated Contact with Patient 01/03/15 1539     Chief Complaint  Patient presents with  . Rectal Pain     (Consider location/radiation/quality/duration/timing/severity/associated sxs/prior Treatment) The history is provided by the patient. No language interpreter was used.  Mr. [name] is a 44 year old male who presents for rectal pain for the past 5-6 days. He states that it is painful to have a bowel movement and he has noticed tiny amounts of bright red blood after wiping with toilet paper. He denies any blood in the stool or in the toilet after bowel movement. He denies any pus or drainage from the rectum. He states he has been doing sitz baths to help but it has not alleviated the pain. It is worse when sleeping and laying flat on his back. He states he has never had anything like this before. She denies any fever, chills, abdominal pain, nausea, vomiting, diarrhea, constipation, dysuria, or hematuria. He denies a history of hemorrhoids or anal fissures.   Past Medical History  Diagnosis Date  . PONV (postoperative nausea and vomiting)    Past Surgical History  Procedure Laterality Date  . Shoulder arthroscopy      right  . Elbow arthroscopy      right  . Tendon repair  06/02/2012    Procedure: TENDON REPAIR;  Surgeon: Johnette AbrahamHarrill C Coley, MD;  Location: [facility] OR;  Service: Plastics;  Laterality: Right;  Nerve repair right pinky finger  . Esophagus surgery     History reviewed. No pertinent family history. History  Substance Use Topics  . Smoking status: Current Every Day Smoker -- 1.00 packs/day    Types: Cigarettes  . Smokeless tobacco: Not on file  . Alcohol Use: 7.2 oz/week    12 Cans of beer per week     Comment: weekends    Review of Systems  Constitutional: Negative for fever.  Gastrointestinal: Positive for rectal pain. Negative for abdominal pain and abdominal distention.  All other systems  reviewed and are negative.     Allergies  Morphine and related  Home Medications   Prior to Admission medications   Medication Sig Start Date End Date Taking? Authorizing Provider  ibuprofen (ADVIL,MOTRIN) 200 MG tablet Take 400 mg by mouth every 6 (six) hours as needed for headache, mild pain or moderate pain.    Yes Historical Provider, MD  cephALEXin (KEFLEX) 500 MG capsule Take 1 capsule (500 mg total) by mouth 4 (four) times daily. 01/03/15   Reola Buckles Patel-Mills, PA-C  ipratropium (ATROVENT) 0.06 % nasal spray Place 2 sprays into both nostrils 4 (four) times daily. Patient not taking: Reported on 01/03/2015 11/13/14   Linna HoffJames D Kindl, MD  lidocaine (XYLOCAINE) 2 % jelly Apply 1 application topically as needed. 01/03/15   Aniela Caniglia Patel-Mills, PA-C  oxyCODONE-acetaminophen (PERCOCET/ROXICET) 5-325 MG per tablet Take 2 tablets by mouth every 4 (four) hours as needed for severe pain. 01/03/15   Ramondo Dietze Patel-Mills, PA-C   BP 155/91 mmHg  Pulse 71  Temp(Src) 98 F (36.7 C) (Oral)  Resp 16  SpO2 99% Physical Exam  Constitutional: He is oriented to person, place, and time. He appears well-developed and well-nourished.  HENT:  Head: Normocephalic and atraumatic.  Eyes: Conjunctivae are normal.  Neck: Normal range of motion. Neck supple.  Cardiovascular: Normal rate, regular rhythm and normal heart sounds.   Pulmonary/Chest: Effort normal and breath sounds normal.  Abdominal: Soft. Normal  appearance. He exhibits no distension and no mass. There is no tenderness. There is no rigidity, no rebound and no guarding.  Genitourinary:  Rectal exam: Chaperone present. Perianal abscess at the 7o'clock position with tenderness.  He had no bogginess on rectal exam but was exquisitely tender. Brown stool. No blood. No external hemorrhoid or anal fissure.    Musculoskeletal: Normal range of motion.  Neurological: He is alert and oriented to person, place, and time.  Skin: Skin is warm and dry.  Psychiatric: He  has a normal mood and affect. His behavior is normal.  Nursing note and vitals reviewed.   ED Course  Procedures (including critical care time) Labs Review Labs Reviewed  CBC WITH DIFFERENTIAL/PLATELET - Abnormal; Notable for the following:    WBC 12.1 (*)    MCV 100.4 (*)    MCH 34.6 (*)    Neutro Abs 8.6 (*)    All other components within normal limits  BASIC METABOLIC PANEL - Abnormal; Notable for the following:    Calcium 8.8 (*)    All other components within normal limits  POC OCCULT BLOOD, ED    Imaging Review Ct Pelvis W Contrast  01/03/2015   CLINICAL DATA:  Pt c/o increasing rectal pain and inflammation x 6 days. Really intense over the last 24hours.  EXAM: CT PELVIS WITH CONTRAST  TECHNIQUE: Multidetector CT imaging of the pelvis was performed using the standard protocol following the bolus administration of intravenous contrast.  CONTRAST:  OMNIPAQUE IOHEXOL 300 MG/ML  SOLN  COMPARISON:  None.  FINDINGS: There is a small area of heterogeneous fluid attenuation measuring approximately 1 cm in greatest dimension, along the left margin of the anus with adjacent, inflammatory haziness. No discrete/defined abscess is seen. There is no evidence of infection above the levator muscles. Rectum is unremarkable as is the visualize colon. No pelvic masses or abnormal fluid collections. Mildly enlarged left external iliac chain lymph node is noted measuring 12 mm in short axis. No other adenopathy. Normal appendix is visualized.  No skeletal abnormality.  No evidence of osteomyelitis.  IMPRESSION: 1. Small ill-defined area low attenuation with adjacent mild inflammation lies along the left margin of the anus consistent with infection, but without a defined abscess. 2. No other acute finding. 3. Mild left external iliac chain adenopathy presumed reactive.   Electronically Signed   By: Amie Portland M.D.   On: 01/03/2015 18:09     EKG Interpretation None      MDM   Final diagnoses:   Perianal infection   Vitals stable and patient is afebrile. Labs are not concerning.  I obtained a CT pelvis with contrast which was negative for rectal abscess. There was a small area of inflammation along the left margin of the anus consistent with infection.  Meds: keflex, percocet,  lidocaine jelly.  I gave the patient return precautions such fever, increased swelling, no improvement in 48 hours.  Patient verbally agrees with the plan.      Catha Gosselin, PA-C 01/04/15 1610  Derwood Kaplan, MD 01/05/15 0157

## 2015-01-03 NOTE — ED Notes (Signed)
Pt c/o increasing rectal pain and inflammation x 6 days.  Pain score 8/10.  Pt sts "I think, I have a hemorrhoid."

## 2018-09-14 DIAGNOSIS — M79642 Pain in left hand: Secondary | ICD-10-CM | POA: Diagnosis not present

## 2018-09-14 DIAGNOSIS — M79641 Pain in right hand: Secondary | ICD-10-CM | POA: Diagnosis not present

## 2018-09-14 DIAGNOSIS — T23009A Burn of unspecified degree of unspecified hand, unspecified site, initial encounter: Secondary | ICD-10-CM | POA: Diagnosis not present

## 2019-12-13 DIAGNOSIS — R072 Precordial pain: Secondary | ICD-10-CM | POA: Diagnosis not present

## 2019-12-13 DIAGNOSIS — Z886 Allergy status to analgesic agent status: Secondary | ICD-10-CM | POA: Diagnosis not present

## 2019-12-13 DIAGNOSIS — R079 Chest pain, unspecified: Secondary | ICD-10-CM | POA: Diagnosis not present

## 2019-12-13 DIAGNOSIS — R0789 Other chest pain: Secondary | ICD-10-CM | POA: Diagnosis not present

## 2019-12-13 DIAGNOSIS — R0602 Shortness of breath: Secondary | ICD-10-CM | POA: Diagnosis not present

## 2019-12-13 DIAGNOSIS — Z79899 Other long term (current) drug therapy: Secondary | ICD-10-CM | POA: Diagnosis not present

## 2019-12-13 DIAGNOSIS — R61 Generalized hyperhidrosis: Secondary | ICD-10-CM | POA: Diagnosis not present

## 2019-12-13 DIAGNOSIS — F17200 Nicotine dependence, unspecified, uncomplicated: Secondary | ICD-10-CM | POA: Diagnosis not present

## 2020-04-28 DIAGNOSIS — I10 Essential (primary) hypertension: Secondary | ICD-10-CM | POA: Diagnosis not present

## 2020-04-28 DIAGNOSIS — F411 Generalized anxiety disorder: Secondary | ICD-10-CM | POA: Diagnosis not present

## 2020-05-19 DIAGNOSIS — K645 Perianal venous thrombosis: Secondary | ICD-10-CM | POA: Diagnosis not present

## 2020-06-09 DIAGNOSIS — R0781 Pleurodynia: Secondary | ICD-10-CM | POA: Diagnosis not present

## 2020-06-09 DIAGNOSIS — K603 Anal fistula: Secondary | ICD-10-CM | POA: Diagnosis not present

## 2020-06-09 DIAGNOSIS — F411 Generalized anxiety disorder: Secondary | ICD-10-CM | POA: Diagnosis not present

## 2020-09-07 DIAGNOSIS — H00021 Hordeolum internum right upper eyelid: Secondary | ICD-10-CM | POA: Diagnosis not present

## 2020-09-22 DIAGNOSIS — H10013 Acute follicular conjunctivitis, bilateral: Secondary | ICD-10-CM | POA: Diagnosis not present

## 2020-09-29 DIAGNOSIS — H00024 Hordeolum internum left upper eyelid: Secondary | ICD-10-CM | POA: Diagnosis not present

## 2020-10-04 DIAGNOSIS — H00022 Hordeolum internum right lower eyelid: Secondary | ICD-10-CM | POA: Diagnosis not present

## 2020-10-20 DIAGNOSIS — H10013 Acute follicular conjunctivitis, bilateral: Secondary | ICD-10-CM | POA: Diagnosis not present

## 2020-11-10 DIAGNOSIS — B079 Viral wart, unspecified: Secondary | ICD-10-CM | POA: Diagnosis not present

## 2020-11-10 DIAGNOSIS — I10 Essential (primary) hypertension: Secondary | ICD-10-CM | POA: Diagnosis not present

## 2020-11-10 DIAGNOSIS — Z0000 Encounter for general adult medical examination without abnormal findings: Secondary | ICD-10-CM | POA: Diagnosis not present

## 2020-11-10 DIAGNOSIS — Z13220 Encounter for screening for lipoid disorders: Secondary | ICD-10-CM | POA: Diagnosis not present

## 2021-01-22 ENCOUNTER — Emergency Department (INDEPENDENT_AMBULATORY_CARE_PROVIDER_SITE_OTHER): Payer: Federal, State, Local not specified - PPO

## 2021-01-22 ENCOUNTER — Emergency Department
Admission: EM | Admit: 2021-01-22 | Discharge: 2021-01-22 | Disposition: A | Discharge status: Home / Self Care | Payer: Federal, State, Local not specified - PPO | Source: Home / Self Care

## 2021-01-22 ENCOUNTER — Other Ambulatory Visit: Payer: Self-pay

## 2021-01-22 DIAGNOSIS — R0781 Pleurodynia: Secondary | ICD-10-CM

## 2021-01-22 DIAGNOSIS — W19XXXA Unspecified fall, initial encounter: Secondary | ICD-10-CM

## 2021-01-22 HISTORY — DX: Anxiety disorder, unspecified: F41.9

## 2021-01-22 HISTORY — DX: Essential (primary) hypertension: I10

## 2021-01-22 MED ORDER — ACETAMINOPHEN 325 MG PO TABS
650.0000 mg | ORAL_TABLET | Freq: Once | ORAL | Status: AC
  Administered 2021-01-22: 650 mg via ORAL

## 2021-01-22 MED ORDER — DICLOFENAC SODIUM 75 MG PO TBEC: 75.0000 mg | DELAYED_RELEASE_TABLET | Freq: Two times a day (BID) | ORAL | 0 refills | Status: AC

## 2021-01-22 NOTE — Discharge Instructions (Addendum)
Advised patient to take medication as directed with food to completion.  Note provided per patient's request

## 2021-01-22 NOTE — ED Provider Notes (Signed)
[facility]    CSN: [id number] Arrival date & time: 01/22/21  1536      History   Chief Complaint Chief Complaint  Patient presents with   Fall    RT Rib pain    HPI [name] is a 50 y.o. male.   HPI 50 year old male presents with right-sided rib pain after fall in shower on Saturday, 01/20/2021.  Patient denies hitting head or LOC.  Reports alternating Tylenol and ibuprofen for pain reports taking Tylenol at 7 AM and ibuprofen at noon.  Past Medical History:  Diagnosis Date   Anxiety    Hypertension    PONV (postoperative nausea and vomiting)     There are no problems to display for this patient.   Past Surgical History:  Procedure Laterality Date   ELBOW ARTHROSCOPY     right   ESOPHAGUS SURGERY     SHOULDER ARTHROSCOPY     right   TENDON REPAIR  06/02/2012   Procedure: TENDON REPAIR;  Surgeon: Johnette Abraham, MD;  Location: [facility] OR;  Service: Plastics;  Laterality: Right;  Nerve repair right pinky finger       Home Medications    Prior to Admission medications   Medication Sig Start Date End Date Taking? Authorizing Provider  acetaminophen (TYLENOL) 500 MG tablet Take 500 mg by mouth every 6 (six) hours as needed.   Yes [provider]  citalopram (CELEXA) 20 MG tablet Take 20 mg by mouth daily.   Yes [provider]  diclofenac (VOLTAREN) 75 MG EC tablet Take 1 tablet (75 mg total) by mouth 2 (two) times daily for 10 days. 01/22/21 02/01/21 Yes Trevor Iha, FNP  lisinopril (ZESTRIL) 10 MG tablet Take 10 mg by mouth daily.   Yes [provider]  cephALEXin (KEFLEX) 500 MG capsule Take 1 capsule (500 mg total) by mouth 4 (four) times daily. Patient not taking: Reported on 01/22/2021 01/03/15   Patel-Mills, Lorelle Formosa, PA-C  ibuprofen (ADVIL,MOTRIN) 200 MG tablet Take 400 mg by mouth every 6 (six) hours as needed for headache, mild pain or moderate pain.     [provider]  ipratropium (ATROVENT) 0.06 % nasal spray  Place 2 sprays into both nostrils 4 (four) times daily. Patient not taking: Reported on 01/03/2015 11/13/14   Linna Hoff, MD  lidocaine (XYLOCAINE) 2 % jelly Apply 1 application topically as needed. Patient not taking: Reported on 01/22/2021 01/03/15   Patel-Mills, Lorelle Formosa, PA-C  oxyCODONE-acetaminophen (PERCOCET/ROXICET) 5-325 MG per tablet Take 2 tablets by mouth every 4 (four) hours as needed for severe pain. Patient not taking: Reported on 01/22/2021 01/03/15   Catha Gosselin, PA-C    Family History Family History  Problem Relation Age of Onset   Healthy Mother    Heart disease Father     Social History Social History   Tobacco Use   Smoking status: Every Day    Packs/day: 1.00    Types: Cigarettes   Smokeless tobacco: Never  Vaping Use   Vaping Use: Never used  Substance Use Topics   Alcohol use: Yes    Alcohol/week: 12.0 standard drinks    Types: 12 Cans of beer per week    Comment: weekends   Drug use: No     Allergies   Morphine and related   Review of Systems Review of Systems  Musculoskeletal:        Right sided posterior rib cage pain x 2 days    Physical Exam Triage Vital  Signs ED Triage Vitals  Enc Vitals Group     BP 01/22/21 1550 (!) 150/84     Pulse Rate 01/22/21 1550 83     Resp 01/22/21 1550 14     Temp 01/22/21 1550 98 F (36.7 C)     Temp Source 01/22/21 1550 Oral     SpO2 01/22/21 1550 97 %     Weight 01/22/21 1551 192 lb (87.1 kg)     Height 01/22/21 1551 6\' 3"  (1.905 m)     Head Circumference --      Peak Flow --      Pain Score 01/22/21 1551 9     Pain Loc --      Pain Edu? --      Excl. in GC? --    No data found.  Updated Vital Signs BP (!) 150/84 (BP Location: Left Arm)   Pulse 83   Temp 98 F (36.7 C) (Oral)   Resp 14   Ht 6\' 3"  (1.905 m)   Wt 192 lb (87.1 kg)   SpO2 97%   BMI 24.00 kg/m   Physical Exam Vitals and nursing note reviewed.  Constitutional:      General: He is in acute distress.     Appearance:  Normal appearance. He is normal weight. He is ill-appearing.  HENT:     Head: Normocephalic and atraumatic.     Mouth/Throat:     Mouth: Mucous membranes are moist.     Pharynx: Oropharynx is clear.  Eyes:     Extraocular Movements: Extraocular movements intact.     Conjunctiva/sclera: Conjunctivae normal.     Pupils: Pupils are equal, round, and reactive to light.  Cardiovascular:     Rate and Rhythm: Normal rate and regular rhythm.     Pulses: Normal pulses.     Heart sounds: Normal heart sounds.  Pulmonary:     Effort: Pulmonary effort is normal. No respiratory distress.     Breath sounds: Normal breath sounds. No wheezing, rhonchi or rales.  Musculoskeletal:        General: Normal range of motion.     Cervical back: Normal range of motion and neck supple. No tenderness.     Comments: Rib cage (right-sided posterior inferior aspect): TTP over fifth through eighth rib cage, no deformity noted  Lymphadenopathy:     Cervical: No cervical adenopathy.  Neurological:     General: No focal deficit present.     Mental Status: He is alert and oriented to person, place, and time.  Psychiatric:        Mood and Affect: Mood normal.        Behavior: Behavior normal.        Thought Content: Thought content normal.     UC Treatments / Results  Labs (all labs ordered are listed, but only abnormal results are displayed) Labs Reviewed - No data to display  EKG   Radiology DG Chest 2 View  Result Date: 01/22/2021 CLINICAL DATA:  Fall, rib pain EXAM: CHEST - 2 VIEW COMPARISON:  12/09/2006 FINDINGS: The heart size and mediastinal contours are within normal limits. Both lungs are clear. The visualized skeletal structures are unremarkable. IMPRESSION: No acute abnormality of the lungs. Electronically Signed   By: 01/24/2021 M.D.   On: 01/22/2021 17:14    Procedures Procedures (including critical care time)  Medications Ordered in UC Medications  acetaminophen (TYLENOL) tablet 650 mg  (650 mg Oral Given 01/22/21 1557)    Initial  Impression / Assessment and Plan / UC Course  I have reviewed the triage vital signs and the nursing notes.  Pertinent labs & imaging results that were available during my care of the patient were reviewed by me and considered in my medical decision making (see chart for details).    MDM: 1.  Fall initial encounter-patient denies hitting head or LOC, 2.  Rib pain-CXR reveals no fracture, Rx'd diclofenac 75 mg twice daily x 10 days.  Work note provided to patient prior to discharge. Final Clinical Impressions(s) / UC Diagnoses   Final diagnoses:  Fall, initial encounter  Rib pain on right side     Discharge Instructions      Advised patient to take medication as directed with food to completion.     ED Prescriptions     Medication Sig Dispense Auth. Provider   diclofenac (VOLTAREN) 75 MG EC tablet Take 1 tablet (75 mg total) by mouth 2 (two) times daily for 10 days. 20 tablet Trevor Iha, FNP      PDMP not reviewed this encounter.   Trevor Iha, FNP 01/22/21 1746

## 2021-01-22 NOTE — ED Triage Notes (Signed)
Pt seen in UC w/ c/o RT side rib pain after a fall in the shower on Saturday. Pt denies hitting head or LOC. Pt endorses alternating tylenol and ibuprofen for pain. Last tylenol was this AM around 0700 and ibuprofen at 1200. Tylenol offered for severe pain.

## 2021-04-02 ENCOUNTER — Other Ambulatory Visit: Payer: Self-pay

## 2021-04-02 ENCOUNTER — Encounter: Payer: Self-pay | Admitting: Emergency Medicine

## 2021-04-02 ENCOUNTER — Ambulatory Visit
Admission: EM | Admit: 2021-04-02 | Discharge: 2021-04-02 | Disposition: A | Discharge status: Home / Self Care | Payer: Federal, State, Local not specified - PPO | Attending: Internal Medicine | Admitting: Internal Medicine

## 2021-04-02 DIAGNOSIS — Z20822 Contact with and (suspected) exposure to covid-19: Secondary | ICD-10-CM | POA: Insufficient documentation

## 2021-04-02 DIAGNOSIS — J029 Acute pharyngitis, unspecified: Secondary | ICD-10-CM | POA: Diagnosis not present

## 2021-04-02 DIAGNOSIS — R590 Localized enlarged lymph nodes: Secondary | ICD-10-CM | POA: Diagnosis not present

## 2021-04-02 LAB — POCT RAPID STREP A (OFFICE): Rapid Strep A Screen: NEGATIVE

## 2021-04-02 MED ORDER — AMOXICILLIN-POT CLAVULANATE 875-125 MG PO TABS: 1.0000 | ORAL_TABLET | Freq: Two times a day (BID) | ORAL | 0 refills | Status: DC

## 2021-04-02 NOTE — Discharge Instructions (Signed)
Your rapid strep test was negative, but still suspicious of strep throat given your symptoms.  Will treat with Augmentin antibiotic.  Throat culture and COVID-19 viral swab are pending.  We will call if these are positive.  Please go to the hospital if no improvement in symptoms or if they worsen in the next 48 hours.

## 2021-04-02 NOTE — ED Triage Notes (Signed)
Sore throat x 2 days. Swelling visible under chin, at base of throat. Temp 100.8 at work. Took tylenol before coming today.

## 2021-04-02 NOTE — ED Provider Notes (Signed)
[facility]    CSN: [id number] Arrival date & time: 04/02/21  0836      History   Chief Complaint Chief Complaint  Patient presents with   Sore Throat    HPI [name] is a 50 y.o. male.   Patient presents with sore throat and lymph node swelling to the neck that has been present for 2 days.  Patient denies any upper respiratory symptoms, cough, sick contacts.  Patient did have temp max of 100.8 this morning.  Has taken Tylenol for fever and pain.  Denies any shortness of breath, feelings of throat swelling, difficulty swallowing, chest pain.  Patient is able to swallow secretions without any issues.   Sore Throat   Past Medical History:  Diagnosis Date   Anxiety    Hypertension    PONV (postoperative nausea and vomiting)     There are no problems to display for this patient.   Past Surgical History:  Procedure Laterality Date   ELBOW ARTHROSCOPY     right   ESOPHAGUS SURGERY     SHOULDER ARTHROSCOPY     right   TENDON REPAIR  06/02/2012   Procedure: TENDON REPAIR;  Surgeon: Johnette Abraham, MD;  Location: [facility] OR;  Service: Plastics;  Laterality: Right;  Nerve repair right pinky finger       Home Medications    Prior to Admission medications   Medication Sig Start Date End Date Taking? Authorizing Provider  amoxicillin-clavulanate (AUGMENTIN) 875-125 MG tablet Take 1 tablet by mouth every 12 (twelve) hours. 04/02/21  Yes Lance Muss, FNP  acetaminophen (TYLENOL) 500 MG tablet Take 500 mg by mouth every 6 (six) hours as needed.    [provider]  citalopram (CELEXA) 20 MG tablet Take 20 mg by mouth daily.    [provider]  ibuprofen (ADVIL,MOTRIN) 200 MG tablet Take 400 mg by mouth every 6 (six) hours as needed for headache, mild pain or moderate pain.     [provider]  ipratropium (ATROVENT) 0.06 % nasal spray Place 2 sprays into both nostrils 4 (four) times daily. Patient not taking: Reported on 01/03/2015  11/13/14   Linna Hoff, MD  lidocaine (XYLOCAINE) 2 % jelly Apply 1 application topically as needed. Patient not taking: Reported on 01/22/2021 01/03/15   Patel-Mills, Lorelle Formosa, PA-C  lisinopril (ZESTRIL) 10 MG tablet Take 10 mg by mouth daily.    [provider]  oxyCODONE-acetaminophen (PERCOCET/ROXICET) 5-325 MG per tablet Take 2 tablets by mouth every 4 (four) hours as needed for severe pain. Patient not taking: Reported on 01/22/2021 01/03/15   Catha Gosselin, PA-C    Family History Family History  Problem Relation Age of Onset   Healthy Mother    Heart disease Father     Social History Social History   Tobacco Use   Smoking status: Every Day    Packs/day: 1.00    Types: Cigarettes   Smokeless tobacco: Never  Vaping Use   Vaping Use: Never used  Substance Use Topics   Alcohol use: Yes    Alcohol/week: 12.0 standard drinks    Types: 12 Cans of beer per week    Comment: weekends   Drug use: No     Allergies   Morphine and related   Review of Systems Review of Systems Per HPI  Physical Exam Triage Vital Signs ED Triage Vitals  Enc Vitals Group     BP 04/02/21 0915 122/80     Pulse Rate 04/02/21  0915 86     Resp 04/02/21 0915 16     Temp 04/02/21 0915 99.4 F (37.4 C)     Temp Source 04/02/21 0915 Oral     SpO2 04/02/21 0915 97 %     Weight --      Height --      Head Circumference --      Peak Flow --      Pain Score 04/02/21 0916 6     Pain Loc --      Pain Edu? --      Excl. in GC? --    No data found.  Updated Vital Signs BP 122/80 (BP Location: Right Arm)   Pulse 86   Temp 99.4 F (37.4 C) (Oral)   Resp 16   SpO2 97%   Visual Acuity Right Eye Distance:   Left Eye Distance:   Bilateral Distance:    Right Eye Near:   Left Eye Near:    Bilateral Near:     Physical Exam Constitutional:      General: He is not in acute distress.    Appearance: Normal appearance. He is not toxic-appearing or diaphoretic.  HENT:     Head:  Normocephalic and atraumatic.     Right Ear: Tympanic membrane and ear canal normal.     Left Ear: Tympanic membrane and ear canal normal.     Nose: Nose normal.     Mouth/Throat:     Mouth: Mucous membranes are moist.     Pharynx: Posterior oropharyngeal erythema present. No oropharyngeal exudate.  Eyes:     Extraocular Movements: Extraocular movements intact.     Conjunctiva/sclera: Conjunctivae normal.  Cardiovascular:     Rate and Rhythm: Normal rate and regular rhythm.     Pulses: Normal pulses.     Heart sounds: Normal heart sounds.  Pulmonary:     Effort: Pulmonary effort is normal. No respiratory distress.     Breath sounds: Normal breath sounds. No stridor. No wheezing, rhonchi or rales.  Lymphadenopathy:     Cervical: Cervical adenopathy present.     Left cervical: Superficial cervical adenopathy present.  Skin:    General: Skin is warm and dry.  Neurological:     General: No focal deficit present.     Mental Status: He is alert and oriented to person, place, and time. Mental status is at baseline.  Psychiatric:        Mood and Affect: Mood normal.        Behavior: Behavior normal.        Thought Content: Thought content normal.        Judgment: Judgment normal.     UC Treatments / Results  Labs (all labs ordered are listed, but only abnormal results are displayed) Labs Reviewed  CULTURE, GROUP A STREP ([facility])  NOVEL CORONAVIRUS, NAA  POCT RAPID STREP A (OFFICE)    EKG   Radiology No results found.  Procedures Procedures (including critical care time)  Medications Ordered in UC Medications - No data to display  Initial Impression / Assessment and Plan / UC Course  I have reviewed the triage vital signs and the nursing notes.  Pertinent labs & imaging results that were available during my care of the patient were reviewed by me and considered in my medical decision making (see chart for details).     Rapid strep test was negative but still  suspicious of strep throat given patient's physical exam and posterior pharynx.  Patient  also has cervical lymphadenopathy which indicates necessity for treatment with antibiotic therapy.  Will treat with Augmentin x7 days.  Advised patient to go to the hospital if no improvement in symptoms in the next 48 hours or if they worsen.  Patient is nontoxic-appearing and does not appear to be in need of immediate medical attention at the hospital at this time.  Throat culture and COVID-19 viral swab are pending.  No signs of peritonsillar abscess on exam.  Fever monitoring and management discussed with patient.Discussed strict return precautions. Patient verbalized understanding and is agreeable with plan.  Final Clinical Impressions(s) / UC Diagnoses   Final diagnoses:  Sore throat  Cervical lymphadenopathy  Encounter for laboratory testing for COVID-19 virus  Encounter for screening laboratory testing for COVID-19 virus     Discharge Instructions      Your rapid strep test was negative, but still suspicious of strep throat given your symptoms.  Will treat with Augmentin antibiotic.  Throat culture and COVID-19 viral swab are pending.  We will call if these are positive.  Please go to the hospital if no improvement in symptoms or if they worsen in the next 48 hours.     ED Prescriptions     Medication Sig Dispense Auth. Provider   amoxicillin-clavulanate (AUGMENTIN) 875-125 MG tablet Take 1 tablet by mouth every 12 (twelve) hours. 14 tablet Lance Muss, FNP      PDMP not reviewed this encounter.   Lance Muss, FNP 04/02/21 1019

## 2021-04-03 LAB — SARS-COV-2, NAA 2 DAY TAT

## 2021-04-03 LAB — NOVEL CORONAVIRUS, NAA: SARS-CoV-2, NAA: NOT DETECTED

## 2021-04-04 LAB — CULTURE, GROUP A STREP (THRC)

## 2021-04-11 ENCOUNTER — Emergency Department
Admission: RE | Admit: 2021-04-11 | Discharge: 2021-04-11 | Disposition: A | Discharge status: Home / Self Care | Payer: Federal, State, Local not specified - PPO | Source: Ambulatory Visit

## 2021-04-11 ENCOUNTER — Other Ambulatory Visit: Payer: Self-pay

## 2021-04-11 VITALS — BP 151/94 | HR 71 | Temp 98.4°F | Resp 15 | Ht 75.0 in | Wt 186.0 lb

## 2021-04-11 DIAGNOSIS — L237 Allergic contact dermatitis due to plants, except food: Secondary | ICD-10-CM | POA: Diagnosis not present

## 2021-04-11 MED ORDER — METHYLPREDNISOLONE SODIUM SUCC 125 MG IJ SOLR
125.0000 mg | Freq: Once | INTRAMUSCULAR | Status: AC
  Administered 2021-04-11: 125 mg via INTRAMUSCULAR

## 2021-04-11 MED ORDER — PREDNISONE 20 MG PO TABS: ORAL_TABLET | ORAL | 0 refills | Status: DC

## 2021-04-11 NOTE — ED Provider Notes (Signed)
[name]    CSN: [id number] Arrival date & time: 04/11/21  1533      History   Chief Complaint Chief Complaint  Patient presents with   Poison Ivy    HPI [name] is a 50 y.o. male.   HPI 50 year old patient presents with poison ivy bilateral upper/lower arms.  Past Medical History:  Diagnosis Date   Anxiety    Hypertension    PONV (postoperative nausea and vomiting)     There are no problems to display for this patient.   Past Surgical History:  Procedure Laterality Date   ELBOW ARTHROSCOPY     right   ESOPHAGUS SURGERY     SHOULDER ARTHROSCOPY     right   TENDON REPAIR  06/02/2012   Procedure: TENDON REPAIR;  Surgeon: Johnette Abraham, MD;  Location: [facility] OR;  Service: Plastics;  Laterality: Right;  Nerve repair right pinky finger       Home Medications    Prior to Admission medications   Medication Sig Start Date End Date Taking? Authorizing Provider  predniSONE (DELTASONE) 20 MG tablet Take 3 tabs PO daily x 3 days, then 2 tabs PO daily x 3 days, then 1 tab PO daily x 3 days 04/11/21  Yes Trevor Iha, FNP  acetaminophen (TYLENOL) 500 MG tablet Take 500 mg by mouth every 6 (six) hours as needed.    [provider]  amoxicillin-clavulanate (AUGMENTIN) 875-125 MG tablet Take 1 tablet by mouth every 12 (twelve) hours. Patient not taking: Reported on 04/11/2021 04/02/21   Lance Muss, FNP  citalopram (CELEXA) 20 MG tablet Take 20 mg by mouth daily.    [provider]  ibuprofen (ADVIL,MOTRIN) 200 MG tablet Take 400 mg by mouth every 6 (six) hours as needed for headache, mild pain or moderate pain.     [provider]  ipratropium (ATROVENT) 0.06 % nasal spray Place 2 sprays into both nostrils 4 (four) times daily. Patient not taking: Reported on 01/03/2015 11/13/14   Linna Hoff, MD  lidocaine (XYLOCAINE) 2 % jelly Apply 1 application topically as needed. Patient not taking: Reported on 01/22/2021 01/03/15    Patel-Mills, Lorelle Formosa, PA-C  lisinopril (ZESTRIL) 10 MG tablet Take 10 mg by mouth daily.    [provider]  oxyCODONE-acetaminophen (PERCOCET/ROXICET) 5-325 MG per tablet Take 2 tablets by mouth every 4 (four) hours as needed for severe pain. Patient not taking: Reported on 01/22/2021 01/03/15   Catha Gosselin, PA-C    Family History Family History  Problem Relation Age of Onset   Healthy Mother    Heart disease Father     Social History Social History   Tobacco Use   Smoking status: Every Day    Packs/day: 1.00    Types: Cigarettes   Smokeless tobacco: Never  Vaping Use   Vaping Use: Never used  Substance Use Topics   Alcohol use: Yes    Alcohol/week: 12.0 standard drinks    Types: 12 Cans of beer per week    Comment: weekends   Drug use: No     Allergies   Morphine and related   Review of Systems Review of Systems  Skin:  Positive for rash.  All other systems reviewed and are negative.   Physical Exam Triage Vital Signs ED Triage Vitals  Enc Vitals Group     BP 04/11/21 1544 (!) 151/94     Pulse Rate 04/11/21 1544 71     Resp 04/11/21 1544 15  Temp 04/11/21 1544 98.4 F (36.9 C)     Temp Source 04/11/21 1544 Oral     SpO2 04/11/21 1544 96 %     Weight 04/11/21 1547 186 lb (84.4 kg)     Height 04/11/21 1547 6\' 3"  (1.905 m)     Head Circumference --      Peak Flow --      Pain Score 04/11/21 1546 1     Pain Loc --      Pain Edu? --      Excl. in GC? --    No data found.  Updated Vital Signs BP (!) 151/94 (BP Location: Right Arm)   Pulse 71   Temp 98.4 F (36.9 C) (Oral)   Resp 15   Ht 6\' 3"  (1.905 m)   Wt 186 lb (84.4 kg)   SpO2 96%   BMI 23.25 kg/m      Physical Exam Vitals and nursing note reviewed.  Constitutional:      General: He is not in acute distress.    Appearance: Normal appearance. He is normal weight. He is not ill-appearing.  HENT:     Head: Normocephalic and atraumatic.     Nose: Nose normal.      Mouth/Throat:     Mouth: Mucous membranes are moist.     Pharynx: Oropharynx is clear.  Eyes:     Extraocular Movements: Extraocular movements intact.     Conjunctiva/sclera: Conjunctivae normal.     Pupils: Pupils are equal, round, and reactive to light.  Cardiovascular:     Rate and Rhythm: Normal rate and regular rhythm.     Pulses: Normal pulses.     Heart sounds: Normal heart sounds.  Pulmonary:     Effort: Pulmonary effort is normal.     Breath sounds: Normal breath sounds. No wheezing, rhonchi or rales.  Musculoskeletal:        General: Normal range of motion.     Cervical back: Normal range of motion and neck supple.  Skin:    General: Skin is warm and dry.     Comments: Bilateral upper/lower arms, abdomen, groin, buttocks-bilaterally: Pruritic erythematous maculopapular eruption with grouped vesicular vesicles in linear patterns.  Neurological:     General: No focal deficit present.     Mental Status: He is alert and oriented to person, place, and time. Mental status is at baseline.  Psychiatric:        Mood and Affect: Mood normal.        Behavior: Behavior normal.        Thought Content: Thought content normal.     UC Treatments / Results  Labs (all labs ordered are listed, but only abnormal results are displayed) Labs Reviewed - No data to display  EKG   Radiology No results found.  Procedures Procedures (including critical care time)  Medications Ordered in UC Medications  methylPREDNISolone sodium succinate (SOLU-MEDROL) 125 mg/2 mL injection 125 mg (125 mg Intramuscular Given 04/11/21 1625)    Initial Impression / Assessment and Plan / UC Course  I have reviewed the triage vital signs and the nursing notes.  Pertinent labs & imaging results that were available during my care of the patient were reviewed by me and considered in my medical decision making (see chart for details).     MDM: Poison ivy dermatitis-IM Solu-Medrol 125 mg given once in  clinic prior to discharge, Rx'd Prednisone taper. Advised/instructed patient to start oral prednisone taper tomorrow morning, Thursday, 04/12/2021.  Advised patient to take medication as directed with food to completion.  Encouraged patient increase daily water intake while taking this medication.  Advised patient to change bedding/ bed linens for the next 3 days to avoid recontamination.  Patient discharged home, hemodynamically stable. Final Clinical Impressions(s) / UC Diagnoses   Final diagnoses:  Poison ivy dermatitis     Discharge Instructions      Advised/instructed patient to start oral prednisone taper tomorrow morning, Thursday, 04/12/2021.  Advised patient to take medication as directed with food to completion.  Encouraged patient increase daily water intake while taking this medication.  Advised patient to change bedding/ bed linens for the next 3 days to avoid recontamination.     ED Prescriptions     Medication Sig Dispense Auth. Provider   predniSONE (DELTASONE) 20 MG tablet Take 3 tabs PO daily x 3 days, then 2 tabs PO daily x 3 days, then 1 tab PO daily x 3 days 18 tablet Trevor Iha, FNP      PDMP not reviewed this encounter.   Trevor Iha, FNP 04/11/21 1630
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
## 2021-04-11 NOTE — Discharge Instructions (Addendum)
Advised/instructed patient to start oral prednisone taper tomorrow morning, Thursday, 04/12/2021.  Advised patient to take medication as directed with food to completion.  Encouraged patient increase daily water intake while taking this medication.  Advised patient to change bedding/ bed linens for the next 3 days to avoid recontamination.

## 2021-04-13 DIAGNOSIS — H04123 Dry eye syndrome of bilateral lacrimal glands: Secondary | ICD-10-CM | POA: Diagnosis not present

## 2021-04-13 DIAGNOSIS — H40033 Anatomical narrow angle, bilateral: Secondary | ICD-10-CM | POA: Diagnosis not present

## 2021-04-24 ENCOUNTER — Other Ambulatory Visit: Payer: Self-pay

## 2021-04-24 ENCOUNTER — Emergency Department
Admission: EM | Admit: 2021-04-24 | Discharge: 2021-04-24 | Disposition: A | Discharge status: Home / Self Care | Payer: Federal, State, Local not specified - PPO | Source: Home / Self Care | Attending: Family Medicine | Admitting: Family Medicine

## 2021-04-24 ENCOUNTER — Encounter: Payer: Self-pay | Admitting: Emergency Medicine

## 2021-04-24 DIAGNOSIS — B029 Zoster without complications: Secondary | ICD-10-CM

## 2021-04-24 DIAGNOSIS — R21 Rash and other nonspecific skin eruption: Secondary | ICD-10-CM

## 2021-04-24 MED ORDER — VALACYCLOVIR HCL 1 G PO TABS: 1000.0000 mg | ORAL_TABLET | Freq: Three times a day (TID) | ORAL | 0 refills | Status: DC

## 2021-04-24 NOTE — Discharge Instructions (Signed)
Take the valacyclovir 3 times a day for 7 days Take 3 doses today May put a cortisone cream for itching May use antihistamines if needed for itching Call or return for problems

## 2021-04-24 NOTE — ED Triage Notes (Signed)
Rash around left side of face near forehead & eye  Rash started on forehead w/ itching yesterday  Seen recently for poison ivy Denies any recent exposure to poison ivy OTC - hydrocortisone cream

## 2021-04-24 NOTE — ED Provider Notes (Signed)
[facility]    CSN: [id number] Arrival date & time: 04/24/21  1040      History   Chief Complaint Chief Complaint  Patient presents with   Rash    HPI [name] is a 50 y.o. male.   HPI Patient has a rash that started yesterday.  It is on the left side of his forehead.  Today it spreading rapidly down around his eye and back up into his scalp.  He states that it itches.  It does not hurt.  He recently had poison ivy so he put some cortisone cream on it.  This does not seem to have helped  Past Medical History:  Diagnosis Date   Anxiety    Hypertension    PONV (postoperative nausea and vomiting)     There are no problems to display for this patient.   Past Surgical History:  Procedure Laterality Date   ELBOW ARTHROSCOPY     right   ESOPHAGUS SURGERY     SHOULDER ARTHROSCOPY     right   TENDON REPAIR  06/02/2012   Procedure: TENDON REPAIR;  Surgeon: Johnette Abraham, MD;  Location: [facility] OR;  Service: Plastics;  Laterality: Right;  Nerve repair right pinky finger       Home Medications    Prior to Admission medications   Medication Sig Start Date End Date Taking? Authorizing Provider  valACYclovir (VALTREX) 1000 MG tablet Take 1 tablet (1,000 mg total) by mouth 3 (three) times daily. 04/24/21  Yes Eustace Moore, MD  acetaminophen (TYLENOL) 500 MG tablet Take 500 mg by mouth every 6 (six) hours as needed.    [provider]  citalopram (CELEXA) 20 MG tablet Take 20 mg by mouth daily.    [provider]  ibuprofen (ADVIL,MOTRIN) 200 MG tablet Take 400 mg by mouth every 6 (six) hours as needed for headache, mild pain or moderate pain.     [provider]  lisinopril (ZESTRIL) 10 MG tablet Take 10 mg by mouth daily.    [provider]    Family History Family History  Problem Relation Age of Onset   Healthy Mother    Heart disease Father     Social History Social History   Tobacco Use   Smoking status:  Every Day    Packs/day: 1.00    Types: Cigarettes   Smokeless tobacco: Never  Vaping Use   Vaping Use: Never used  Substance Use Topics   Alcohol use: Yes    Alcohol/week: 12.0 standard drinks    Types: 12 Cans of beer per week    Comment: weekends   Drug use: No     Allergies   Morphine and related   Review of Systems Review of Systems See HPI  Physical Exam Triage Vital Signs ED Triage Vitals  Enc Vitals Group     BP 04/24/21 1112 123/81     Pulse Rate 04/24/21 1112 81     Resp 04/24/21 1112 15     Temp 04/24/21 1112 99 F (37.2 C)     Temp Source 04/24/21 1112 Oral     SpO2 04/24/21 1112 100 %     Weight --      Height --      Head Circumference --      Peak Flow --      Pain Score 04/24/21 1114 1     Pain Loc --      Pain Edu? --  Excl. in GC? --    No data found.  Updated Vital Signs BP 123/81 (BP Location: Right Arm)   Pulse 81   Temp 99 F (37.2 C) (Oral)   Resp 15   SpO2 100%     Physical Exam Constitutional:      General: He is not in acute distress.    Appearance: He is well-developed.  HENT:     Head: Normocephalic and atraumatic.     Mouth/Throat:     Comments: Mask is in place Eyes:     Conjunctiva/sclera: Conjunctivae normal.     Pupils: Pupils are equal, round, and reactive to light.  Cardiovascular:     Rate and Rhythm: Normal rate.  Pulmonary:     Effort: Pulmonary effort is normal. No respiratory distress.  Abdominal:     General: There is no distension.     Palpations: Abdomen is soft.  Musculoskeletal:        General: Normal range of motion.     Cervical back: Normal range of motion.  Skin:    General: Skin is warm and dry.     Findings: Rash present.     Comments: Patient has a scattered vesicular rash that includes the scalp on the left side down to the eyebrow  Neurological:     Mental Status: He is alert.     UC Treatments / Results  Labs (all labs ordered are listed, but only abnormal results are  displayed) Labs Reviewed - No data to display  EKG   Radiology No results found.  Procedures Procedures (including critical care time)  Medications Ordered in UC Medications - No data to display  Initial Impression / Assessment and Plan / UC Course  I have reviewed the triage vital signs and the nursing notes.  Pertinent labs & imaging results that were available during my care of the patient were reviewed by me and considered in my medical decision making (see chart for details).     Early shingles.Patient works as a Buyer, retail.  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 Provider   valACYclovir (VALTREX) 1000 MG tablet Take 1 tablet (1,000 mg total) by mouth 3 (three) times daily. 21 tablet Eustace Moore, MD      PDMP not reviewed this encounter.   Eustace Moore, MD 04/24/21 1134

## 2021-12-09 ENCOUNTER — Other Ambulatory Visit: Payer: Self-pay

## 2021-12-09 ENCOUNTER — Encounter (HOSPITAL_COMMUNITY): Payer: Self-pay | Admitting: *Deleted

## 2021-12-09 ENCOUNTER — Emergency Department (HOSPITAL_COMMUNITY)
Admission: EM | Admit: 2021-12-09 | Discharge: 2021-12-09 | Disposition: A | Discharge status: Home / Self Care | Payer: Federal, State, Local not specified - PPO | Attending: Emergency Medicine | Admitting: Emergency Medicine

## 2021-12-09 ENCOUNTER — Emergency Department (HOSPITAL_COMMUNITY): Payer: Federal, State, Local not specified - PPO

## 2021-12-09 DIAGNOSIS — R202 Paresthesia of skin: Secondary | ICD-10-CM | POA: Diagnosis not present

## 2021-12-09 DIAGNOSIS — G5621 Lesion of ulnar nerve, right upper limb: Secondary | ICD-10-CM

## 2021-12-09 DIAGNOSIS — M25521 Pain in right elbow: Secondary | ICD-10-CM | POA: Diagnosis not present

## 2021-12-09 DIAGNOSIS — M7711 Lateral epicondylitis, right elbow: Secondary | ICD-10-CM

## 2021-12-09 DIAGNOSIS — X509XXA Other and unspecified overexertion or strenuous movements or postures, initial encounter: Secondary | ICD-10-CM | POA: Diagnosis not present

## 2021-12-09 DIAGNOSIS — M7989 Other specified soft tissue disorders: Secondary | ICD-10-CM | POA: Diagnosis not present

## 2021-12-09 MED ORDER — PREDNISONE 20 MG PO TABS: 40.0000 mg | ORAL_TABLET | Freq: Every day | ORAL | 0 refills | Status: DC

## 2021-12-09 NOTE — Discharge Instructions (Addendum)
Please use Tylenol or ibuprofen for pain.  You may use 600 mg ibuprofen every 6 hours or 1000 mg of Tylenol every 6 hours.  You may choose to alternate between the 2.  This would be most effective.  Not to exceed 4 g of Tylenol within 24 hours.  Not to exceed 3200 mg ibuprofen 24 hours.  Recommend rest, ice, compression, elevation of the affected extremity.  You can use ice for up to 15 minutes at a time without a break.  Try to keep the arm elevated and do not perform activities that seem to worsen your pain.  I attached a number of rehab exercises for different elbow conditions, and most suspicious and think that you should do the ulnar nerve contusion rehab exercises, however you may find some relief especially from tennis elbow rehab as he seemed to have some aspect of lateral epicondylitis in addition to the cubital tunnel syndrome.  I recommend follow-up with orthopedic doctor as soon as possible for further evaluation.
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
## 2021-12-11 ENCOUNTER — Ambulatory Visit: Payer: Federal, State, Local not specified - PPO | Admitting: Orthopedic Surgery

## 2021-12-11 ENCOUNTER — Encounter: Payer: Self-pay | Admitting: Orthopedic Surgery

## 2021-12-11 VITALS — Ht 74.0 in | Wt 190.0 lb

## 2021-12-11 DIAGNOSIS — S56291A Other injury of other flexor muscle, fascia and tendon at forearm level, right arm, initial encounter: Secondary | ICD-10-CM
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
## 2022-08-22 ENCOUNTER — Other Ambulatory Visit: Payer: Federal, State, Local not specified - PPO

## 2022-08-22 DIAGNOSIS — I10 Essential (primary) hypertension: Secondary | ICD-10-CM | POA: Diagnosis not present

## 2022-08-22 DIAGNOSIS — I152 Hypertension secondary to endocrine disorders: Secondary | ICD-10-CM | POA: Diagnosis not present

## 2022-08-22 DIAGNOSIS — E1159 Type 2 diabetes mellitus with other circulatory complications: Secondary | ICD-10-CM

## 2022-08-22 DIAGNOSIS — Z13220 Encounter for screening for lipoid disorders: Secondary | ICD-10-CM

## 2022-08-22 DIAGNOSIS — Z125 Encounter for screening for malignant neoplasm of prostate: Secondary | ICD-10-CM

## 2022-08-23 ENCOUNTER — Other Ambulatory Visit: Payer: Federal, State, Local not specified - PPO

## 2022-08-26 ENCOUNTER — Other Ambulatory Visit: Payer: Self-pay

## 2022-08-28 LAB — COMPLETE METABOLIC PANEL WITH GFR
AG Ratio: 1.5 (calc) (ref 1.0–2.5)
ALT: 54 U/L — ABNORMAL HIGH (ref 9–46)
AST: 41 U/L — ABNORMAL HIGH (ref 10–35)
Albumin: 4 g/dL (ref 3.6–5.1)
Alkaline phosphatase (APISO): 64 U/L (ref 35–144)
BUN: 15 mg/dL (ref 7–25)
CO2: 25 mmol/L (ref 20–32)
Calcium: 9.2 mg/dL (ref 8.6–10.3)
Chloride: 101 mmol/L (ref 98–110)
Creat: 0.92 mg/dL (ref 0.70–1.30)
Globulin: 2.6 g/dL (calc) (ref 1.9–3.7)
Glucose, Bld: 111 mg/dL — ABNORMAL HIGH (ref 65–99)
Potassium: 5 mmol/L (ref 3.5–5.3)
Sodium: 135 mmol/L (ref 135–146)
Total Bilirubin: 0.6 mg/dL (ref 0.2–1.2)
Total Protein: 6.6 g/dL (ref 6.1–8.1)
eGFR: 101 mL/min/{1.73_m2} (ref >=60)

## 2022-08-28 LAB — CBC WITH DIFFERENTIAL/PLATELET
Absolute Monocytes: 569 cells/uL (ref 200–950)
Basophils Absolute: 39 cells/uL (ref 0–200)
Basophils Relative: 0.5 %
Eosinophils Absolute: 164 cells/uL (ref 15–500)
Eosinophils Relative: 2.1 %
HCT: 46.1 % (ref 38.5–50.0)
Hemoglobin: 16 g/dL (ref 13.2–17.1)
Lymphs Abs: 3128 cells/uL (ref 850–3900)
MCH: 34.3 pg — ABNORMAL HIGH (ref 27.0–33.0)
MCHC: 34.7 g/dL (ref 32.0–36.0)
MCV: 98.9 fL (ref 80.0–100.0)
MPV: 10.2 fL (ref 7.5–12.5)
Monocytes Relative: 7.3 %
Neutro Abs: 3900 cells/uL (ref 1500–7800)
Neutrophils Relative %: 50 %
Platelets: 245 10*3/uL (ref 140–400)
RBC: 4.66 10*6/uL (ref 4.20–5.80)
RDW: 11.8 % (ref 11.0–15.0)
Total Lymphocyte: 40.1 %
WBC: 7.8 10*3/uL (ref 3.8–10.8)

## 2022-08-28 LAB — LIPID PANEL
Cholesterol: 157 mg/dL (ref <200)
HDL: 66 mg/dL (ref >=40)
LDL Cholesterol (Calc): 79 mg/dL (calc)
Non-HDL Cholesterol (Calc): 91 mg/dL (calc) (ref <130)
Total CHOL/HDL Ratio: 2.4 (calc) (ref <5.0)
Triglycerides: 50 mg/dL (ref <150)

## 2022-08-28 LAB — HEMOGLOBIN A1C
Hgb A1c MFr Bld: 5.6 % of total Hgb (ref <5.7)
Mean Plasma Glucose: 114 mg/dL
eAG (mmol/L): 6.3 mmol/L

## 2022-08-28 LAB — TEST AUTHORIZATION

## 2022-08-29 ENCOUNTER — Ambulatory Visit: Payer: Federal, State, Local not specified - PPO

## 2022-08-29 ENCOUNTER — Ambulatory Visit: Payer: Federal, State, Local not specified - PPO | Admitting: Family Medicine

## 2022-08-29 ENCOUNTER — Encounter: Payer: Self-pay | Admitting: Family Medicine

## 2022-08-29 VITALS — BP 118/74 | HR 86 | Temp 98.0°F | Ht 74.0 in | Wt 202.0 lb

## 2022-08-29 DIAGNOSIS — N529 Male erectile dysfunction, unspecified: Secondary | ICD-10-CM

## 2022-08-29 DIAGNOSIS — Z0000 Encounter for general adult medical examination without abnormal findings: Secondary | ICD-10-CM | POA: Insufficient documentation

## 2022-08-29 DIAGNOSIS — Z1211 Encounter for screening for malignant neoplasm of colon: Secondary | ICD-10-CM | POA: Diagnosis not present

## 2022-08-29 DIAGNOSIS — Z1159 Encounter for screening for other viral diseases: Secondary | ICD-10-CM

## 2022-08-29 DIAGNOSIS — I10 Essential (primary) hypertension: Secondary | ICD-10-CM

## 2022-08-29 DIAGNOSIS — Z114 Encounter for screening for human immunodeficiency virus [HIV]: Secondary | ICD-10-CM

## 2022-08-29 DIAGNOSIS — Z0001 Encounter for general adult medical examination with abnormal findings: Secondary | ICD-10-CM | POA: Diagnosis not present

## 2022-08-29 DIAGNOSIS — R748 Abnormal levels of other serum enzymes: Secondary | ICD-10-CM

## 2022-08-29 NOTE — Progress Notes (Signed)
New Patient Office Visit  Subjective    Patient ID: [name], male    DOB: 03-Oct-1970  Age: 52 y.o. MRN: [id number]  CC: No chief complaint on file.   HPI [name] presents to establish care. Oriented to practice routines and expectations. Mr [name] has a PMH significant for anxiety and HTN. He was startedon Lisinopril in 2020 when he was going though a divorce, has since lost 40-50 lbs and would like to trial off of Lisinopril. Concerns today include decrease sex drive and erectile dysfunction.  Colon CA: due, cologuard ordered Vaccines: shingles, Dtap today, flu/covid UTD Tobacco: 1ppd since 52yo, interested in quitting, not ready to start medication at this time.    Outpatient Encounter Medications as of 08/29/2022  Medication Sig   acetaminophen (TYLENOL) 500 MG tablet Take 500 mg by mouth every 6 (six) hours as needed.   ibuprofen (ADVIL,MOTRIN) 200 MG tablet Take 400 mg by mouth every 6 (six) hours as needed for headache, mild pain or moderate pain.    lisinopril (ZESTRIL) 10 MG tablet Take 10 mg by mouth daily.   sildenafil (REVATIO) 20 MG tablet Take 20 mg by mouth daily.   [DISCONTINUED] citalopram (CELEXA) 20 MG tablet Take 20 mg by mouth daily.   [DISCONTINUED] predniSONE (DELTASONE) 20 MG tablet Take 2 tablets (40 mg total) by mouth daily.   [DISCONTINUED] valACYclovir (VALTREX) 1000 MG tablet Take 1 tablet (1,000 mg total) by mouth 3 (three) times daily. (Patient not taking: Reported on 12/11/2021)   No facility-administered encounter medications on file as of 08/29/2022.    Past Medical History:  Diagnosis Date   Anxiety    Hypertension    PONV (postoperative nausea and vomiting)     Past Surgical History:  Procedure Laterality Date   ELBOW ARTHROSCOPY     right   ESOPHAGUS SURGERY     SHOULDER ARTHROSCOPY     right   TENDON REPAIR  06/02/2012   Procedure: TENDON REPAIR;  Surgeon: Dennie Bible, MD;  Location: [place];  Service: Plastics;   Laterality: Right;  Nerve repair right pinky finger    Family History  Problem Relation Age of Onset   Healthy Mother    Heart disease Father     Social History   Socioeconomic History   Marital status: Significant Other    Spouse name: Not on file   Number of children: Not on file   Years of education: Not on file   Highest education level: Not on file  Occupational History   Not on file  Tobacco Use   Smoking status: Every Day    Packs/day: 1.00    Types: Cigarettes   Smokeless tobacco: Never  Vaping Use   Vaping Use: Never used  Substance and Sexual Activity   Alcohol use: Yes    Alcohol/week: 12.0 standard drinks of alcohol    Types: 12 Cans of beer per week    Comment: weekends   Drug use: No   Sexual activity: Not on file  Other Topics Concern   Not on file  Social History Narrative   Not on file   Social Determinants of Health   Financial Resource Strain: Not on file  Food Insecurity: Not on file  Transportation Needs: Not on file  Physical Activity: Not on file  Stress: Not on file  Social Connections: Not on file  Intimate Partner Violence: Not on file    Review of Systems  Constitutional: Negative.   HENT: Negative.  Eyes: Negative.   Respiratory: Negative.    Cardiovascular: Negative.   Gastrointestinal: Negative.   Genitourinary: Negative.   Musculoskeletal: Negative.   Skin: Negative.   Neurological: Negative.   Endo/Heme/Allergies: Negative.   Psychiatric/Behavioral: Negative.    All other systems reviewed and are negative.       Objective    BP 118/74 (BP Location: Right Arm, Patient Position: Sitting, Cuff Size: Normal)   Pulse 86   Temp 98 F (36.7 C) (Oral)   Ht '6\' 2"'$  (1.88 m)   Wt 202 lb (91.6 kg)   SpO2 99%   BMI 25.94 kg/m   Physical Exam Vitals and nursing note reviewed.  Constitutional:      Appearance: Normal appearance. He is normal weight.  HENT:     Head: Normocephalic and atraumatic.     Right Ear:  Tympanic membrane, ear canal and external ear normal.     Left Ear: Tympanic membrane, ear canal and external ear normal.     Nose: Nose normal.     Mouth/Throat:     Mouth: Mucous membranes are moist.     Pharynx: Oropharynx is clear.  Eyes:     Extraocular Movements: Extraocular movements intact.     Right eye: Normal extraocular motion and no nystagmus.     Left eye: Normal extraocular motion and no nystagmus.     Conjunctiva/sclera: Conjunctivae normal.     Pupils: Pupils are equal, round, and reactive to light.  Cardiovascular:     Rate and Rhythm: Normal rate and regular rhythm.     Pulses: Normal pulses.     Heart sounds: Normal heart sounds.  Pulmonary:     Effort: Pulmonary effort is normal.     Breath sounds: Normal breath sounds.  Abdominal:     General: Bowel sounds are normal.     Palpations: Abdomen is soft.  Genitourinary:    Comments: Deferred using shared decision making Musculoskeletal:        General: Normal range of motion.     Cervical back: Normal range of motion and neck supple.  Skin:    General: Skin is warm and dry.     Capillary Refill: Capillary refill takes less than 2 seconds.  Neurological:     General: No focal deficit present.     Mental Status: He is alert. Mental status is at baseline.  Psychiatric:        Mood and Affect: Mood normal.        Speech: Speech normal.        Behavior: Behavior normal.        Thought Content: Thought content normal.        Cognition and Memory: Cognition and memory normal.        Judgment: Judgment normal.         Assessment & Plan:   Problem List Items Addressed This Visit       Cardiovascular and Mediastinum   Primary hypertension    Chronic. Currently taking Lisinopril '10mg'$  daily and BP is well controlled. Would like to trial off. He was started on Lisinopril during a time of extreme stress at home and this has resolved. He has also lost 40-50 pounds. Will trial off Lisinopril and he will check BP  BID and report values to office. Will resume if BP sustains >140/90. Follow up in 1 month.      Relevant Medications   sildenafil (REVATIO) 20 MG tablet     Other   Physical  exam, annual - Primary    Today your medical history was reviewed and routine physical exam with labs was performed. Recommend 150 minutes of moderate intensity exercise weekly and consuming a well-balanced diet. Advised to stop smoking if a smoker, avoid smoking if a non-smoker, limit alcohol consumption to 1 drink per day for women and 2 drinks per day for men, and avoid illicit drug use. Counseled on safe sex practices and offered STI testing today. Counseled on the importance of sunscreen use. Counseled in mental health awareness and when to seek medical care. Vaccine maintenance discussed. Appropriate health maintenance items reviewed. Return to office in 1 year for annual physical exam.       Erectile dysfunction    Currently taking Sildenafil '20mg'$  daily PRN. Will check Testosterone.      Relevant Orders   Testosterone , Free and Total   Other Visit Diagnoses     Colon cancer screening       Relevant Orders   Cologuard   Elevated liver enzymes       Relevant Orders   US Abdomen Limited RUQ (LIVER/GB)   Screening for HIV (human immunodeficiency virus)       Relevant Orders   HIV Antibody (routine testing w rflx)   Need for hepatitis C screening test       Relevant Orders   Hepatitis C antibody       Return in about 4 weeks (around 09/26/2022) for BP follow-up.   Rubie Maid, FNP

## 2022-08-29 NOTE — Assessment & Plan Note (Signed)
Chronic. Currently taking Lisinopril '10mg'$  daily and BP is well controlled. Would like to trial off. He was started on Lisinopril during a time of extreme stress at home and this has resolved. He has also lost 40-50 pounds. Will trial off Lisinopril and he will check BP BID and report values to office. Will resume if BP sustains >140/90. Follow up in 1 month.

## 2022-08-29 NOTE — Assessment & Plan Note (Signed)

## 2022-08-29 NOTE — Assessment & Plan Note (Signed)
Currently taking Sildenafil '20mg'$  daily PRN. Will check Testosterone.

## 2022-08-29 NOTE — Patient Instructions (Signed)
It was great to meet you today and I'm excited to have you join the [facility] practice. I hope you had a positive experience today! If you feel so inclined, please feel free to recommend our practice to friends and family. Mila Merry, FNP-C

## 2022-08-30 ENCOUNTER — Ambulatory Visit (INDEPENDENT_AMBULATORY_CARE_PROVIDER_SITE_OTHER): Payer: Federal, State, Local not specified - PPO

## 2022-08-30 DIAGNOSIS — Z23 Encounter for immunization: Secondary | ICD-10-CM

## 2022-08-30 NOTE — Progress Notes (Signed)
Pt came in to vaccine shot

## 2022-09-02 LAB — TESTOSTERONE, FREE & TOTAL
Free Testosterone: 53 pg/mL (ref 35.0–155.0)
Testosterone, Total, LC-MS-MS: 447 ng/dL (ref 250–1100)

## 2022-09-02 LAB — HIV ANTIBODY (ROUTINE TESTING W REFLEX): HIV 1&2 Ab, 4th Generation: NONREACTIVE

## 2022-09-02 LAB — HEPATITIS C ANTIBODY: Hepatitis C Ab: NONREACTIVE

## 2022-09-19 ENCOUNTER — Other Ambulatory Visit: Payer: Federal, State, Local not specified - PPO

## 2022-09-25 LAB — COLOGUARD: COLOGUARD: NEGATIVE

## 2022-09-26 ENCOUNTER — Other Ambulatory Visit: Payer: Federal, State, Local not specified - PPO
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
## 2023-01-23 ENCOUNTER — Encounter: Payer: Self-pay | Admitting: Family Medicine

## 2023-01-23 ENCOUNTER — Ambulatory Visit: Payer: Federal, State, Local not specified - PPO | Admitting: Family Medicine

## 2023-01-23 ENCOUNTER — Encounter: Payer: Self-pay | Admitting: Gastroenterology

## 2023-01-23 VITALS — BP 150/90 | HR 79 | Temp 97.8°F | Ht 74.0 in | Wt 211.0 lb

## 2023-01-23 DIAGNOSIS — I10 Essential (primary) hypertension: Secondary | ICD-10-CM

## 2023-01-23 DIAGNOSIS — K921 Melena: Secondary | ICD-10-CM | POA: Diagnosis not present

## 2023-01-23 NOTE — Assessment & Plan Note (Signed)
BP elevated at todays visit. He reports he is monitoring his BP at work 2-3 times weekly and it stays <140/90. Will record log over the next week and bring to office. If BP is sustaining elevated will restart Lisinopril.

## 2023-01-23 NOTE — Assessment & Plan Note (Addendum)
No red flags on exam. Will order occult stool and refer to GI for colonoscopy. Insturcted to seek emergent medical care for acute worsening of abdominal pain, fever, lightheadedness, dizziness, or worsening blood in stool.

## 2023-01-23 NOTE — Progress Notes (Signed)
Subjective:  HPI: [name] is a 52 y.o. male presenting on 01/23/2023 for Follow-up (bright red blood in stool (random) for past 10 days or so - JBG\\\)   HPI Patient is in today for bright red blood in his stool for the past two weeks.He does have a history of external hemorrhoids that have been banded. He reports some lower abdominal tightness. His stools are loose and this is his baseline. His diet has been healthier than normal. He also reports a new umbilical bulge. Denies constipation, straining, fever, chills, body aches, nausea, vomiting. He did have a recent negative cologuard.  Review of Systems  All other systems reviewed and are negative.   Relevant past medical history reviewed and updated as indicated.   Past Medical History:  Diagnosis Date   Anxiety    Hypertension    PONV (postoperative nausea and vomiting)      Past Surgical History:  Procedure Laterality Date   ELBOW ARTHROSCOPY     right   ESOPHAGUS SURGERY     SHOULDER ARTHROSCOPY     right   TENDON REPAIR  06/02/2012   Procedure: TENDON REPAIR;  Surgeon: Johnette Abraham, MD;  Location: [facility];  Service: Plastics;  Laterality: Right;  Nerve repair right pinky finger    Allergies and medications reviewed and updated.   Current Outpatient Medications:    acetaminophen (TYLENOL) 500 MG tablet, Take 500 mg by mouth every 6 (six) hours as needed., Disp: , Rfl:    ibuprofen (ADVIL,MOTRIN) 200 MG tablet, Take 400 mg by mouth every 6 (six) hours as needed for headache, mild pain or moderate pain. , Disp: , Rfl:    lisinopril (ZESTRIL) 10 MG tablet, Take 10 mg by mouth daily. (Patient not taking: Reported on 01/23/2023), Disp: , Rfl:    sildenafil (REVATIO) 20 MG tablet, Take 20 mg by mouth daily. (Patient not taking: Reported on 01/23/2023), Disp: , Rfl:   Allergies  Allergen Reactions   Morphine And Codeine Anaphylaxis    Objective:   BP (!) 150/90   Pulse 79   Temp 97.8 F (36.6 C) (Oral)   Ht  6\' 2"  (1.88 m)   Wt 211 lb (95.7 kg)   SpO2 99%   BMI 27.09 kg/m      01/23/2023    8:10 AM 08/29/2022    9:05 AM 08/29/2022    8:35 AM  Vitals with BMI  Height 6\' 2"   6\' 2"   Weight 211 lbs  202 lbs  BMI 27.08  25.92  Systolic 150 118 409  Diastolic 90 74 86  Pulse 79  86     Physical Exam Vitals and nursing note reviewed.  Constitutional:      Appearance: Normal appearance. He is normal weight.  HENT:     Head: Normocephalic and atraumatic.  Abdominal:     General: Bowel sounds are normal. There is no distension.     Palpations: Abdomen is soft.     Tenderness: There is no abdominal tenderness. There is no guarding or rebound.     Hernia: A hernia is present. Hernia is present in the umbilical area.  Skin:    General: Skin is warm and dry.     Capillary Refill: Capillary refill takes less than 2 seconds.  Neurological:     General: No focal deficit present.     Mental Status: He is alert and oriented to person, place, and time. Mental status is at baseline.  Psychiatric:  Mood and Affect: Mood normal.        Behavior: Behavior normal.        Thought Content: Thought content normal.        Judgment: Judgment normal.     Assessment & Plan:  Blood in stool Assessment & Plan: No red flags on exam. Will order occult stool and refer to GI for colonoscopy. Insturcted to seek emergent medical care for acute worsening of abdominal pain, fever, lightheadedness, dizziness, or worsening blood in stool.  Orders: -     Ambulatory referral to Gastroenterology -     Fecal Globin By Immunochemistry  Primary hypertension Assessment & Plan: BP elevated at todays visit. He reports he is monitoring his BP at work 2-3 times weekly and it stays <140/90. Will record log over the next week and bring to office. If BP is sustaining elevated will restart Lisinopril.      Follow up plan: Return if symptoms worsen or fail to improve.  Park Meo, FNP

## 2023-04-22 ENCOUNTER — Ambulatory Visit: Payer: Federal, State, Local not specified - PPO | Admitting: Gastroenterology

## 2023-06-19 ENCOUNTER — Encounter: Payer: Self-pay | Admitting: Family Medicine

## 2023-06-19 ENCOUNTER — Ambulatory Visit: Payer: Federal, State, Local not specified - PPO | Admitting: Family Medicine

## 2023-06-19 VITALS — BP 138/80 | HR 78 | Temp 98.5°F | Ht 74.0 in | Wt 214.5 lb

## 2023-06-19 DIAGNOSIS — B079 Viral wart, unspecified: Secondary | ICD-10-CM

## 2023-06-19 NOTE — Progress Notes (Signed)
Subjective:    Patient ID: [name], male    DOB: 05-Oct-1970, 52 y.o.   MRN: [id number]  HPI Patient has a large wart on the dorsum of his right thumb.  The wart is on the ulnar side of the PIP joint.  It is approximately 1.2 cm in diameter.  It is large.  He has tried cutting it himself, wart remover, freezing it.  None of this is working.  He is requesting that I remove the wart today. Past Medical History:  Diagnosis Date   Anxiety    Hypertension    PONV (postoperative nausea and vomiting)    Past Surgical History:  Procedure Laterality Date   ELBOW ARTHROSCOPY     right   ESOPHAGUS SURGERY     SHOULDER ARTHROSCOPY     right   TENDON REPAIR  06/02/2012   Procedure: TENDON REPAIR;  Surgeon: Johnette Abraham, MD;  Location: [facility];  Service: Plastics;  Laterality: Right;  Nerve repair right pinky finger   Current Outpatient Medications on File Prior to Visit  Medication Sig Dispense Refill   acetaminophen (TYLENOL) 500 MG tablet Take 500 mg by mouth every 6 (six) hours as needed.     ibuprofen (ADVIL,MOTRIN) 200 MG tablet Take 400 mg by mouth every 6 (six) hours as needed for headache, mild pain or moderate pain.      sildenafil (REVATIO) 20 MG tablet Take 20 mg by mouth daily.     lisinopril (ZESTRIL) 10 MG tablet Take 10 mg by mouth daily. (Patient not taking: Reported on 06/19/2023)     No current facility-administered medications on file prior to visit.   Allergies  Allergen Reactions   Morphine And Codeine Anaphylaxis   Social History   Socioeconomic History   Marital status: Significant Other    Spouse name: Not on file   Number of children: Not on file   Years of education: Not on file   Highest education level: Not on file  Occupational History   Not on file  Tobacco Use   Smoking status: Every Day    Current packs/day: 1.00    Types: Cigarettes   Smokeless tobacco: Never  Vaping Use   Vaping status: Never Used  Substance and Sexual Activity    Alcohol use: Yes    Alcohol/week: 12.0 standard drinks of alcohol    Types: 12 Cans of beer per week    Comment: weekends   Drug use: No   Sexual activity: Not on file  Other Topics Concern   Not on file  Social History Narrative   Not on file   Social Drivers of Health   Financial Resource Strain: Not on file  Food Insecurity: Not on file  Transportation Needs: Not on file  Physical Activity: Not on file  Stress: Not on file  Social Connections: Unknown (11/12/2021)   Received from [facility]   Social Network    Social Network: Not on file  Intimate Partner Violence: Unknown (10/04/2021)   Received from [facility], [facility]   HITS    Physically Hurt: Not on file    Insult or Talk Down To: Not on file    Threaten Physical Harm: Not on file    Scream or Curse: Not on file     Review of Systems  All other systems reviewed and are negative.      Objective:   Physical Exam Vitals reviewed.  Constitutional:      Appearance:  Normal appearance.  Cardiovascular:     Rate and Rhythm: Normal rate and regular rhythm.     Pulses: Normal pulses.     Heart sounds: Normal heart sounds. No murmur heard.    No gallop.  Pulmonary:     Effort: Pulmonary effort is normal. No respiratory distress.     Breath sounds: Normal breath sounds. No wheezing, rhonchi or rales.  Neurological:     Mental Status: He is alert.      1.2 cm wart on the dorsal right thumb PIP joint     Assessment & Plan:  Viral wart of thumb Using sterile technique, I anesthetized the skin around the wart with 0.1% lidocaine.  I then used a razor blade and removed the vast majority of the wart using a shave biopsy technique.  This brought me down to the dermal.  layer.  I then applied Drysol to stop the bleeding.  I then treated the base of the wart and the exposed dermal layer With liquid nitrogen cryotherapy for a total of 30 seconds.  I wrapped the thumb with Neosporin and a  Band-Aid.  Wound care was discussed.  If recurrent would recommend dermatology consultation as this is the most aggressive treatment I feel comfortable performing on this lesion

## 2023-07-25 ENCOUNTER — Ambulatory Visit: Payer: Federal, State, Local not specified - PPO | Admitting: Internal Medicine

## 2024-03-23 ENCOUNTER — Ambulatory Visit
Admission: EM | Admit: 2024-03-23 | Discharge: 2024-03-23 | Disposition: A | Discharge status: Home / Self Care | Attending: Family Medicine | Admitting: Family Medicine

## 2024-03-23 ENCOUNTER — Other Ambulatory Visit: Payer: Self-pay

## 2024-03-23 DIAGNOSIS — H01002 Unspecified blepharitis right lower eyelid: Secondary | ICD-10-CM

## 2024-03-23 MED ORDER — ERYTHROMYCIN 5 MG/GM OP OINT: 1.0000 | TOPICAL_OINTMENT | Freq: Every day | OPHTHALMIC | 1 refills | Status: AC

## 2024-03-23 NOTE — Discharge Instructions (Addendum)
 Wash your area with baby shampoo rinse thoroughly Apply the erythromycin  ointment every night May use twice a day while your eyes are irritated  Follow-up on your blood pressure

## 2024-03-23 NOTE — ED Triage Notes (Signed)
 Left eye lump to bottom eyelid, sts he keeps getting styes. Has redness to right lower eyelid. Has had drainage out of both eyes, but mostly to left eye. Blinking has been irritating to left eye. No otc meds.

## 2024-04-02 DIAGNOSIS — H00015 Hordeolum externum left lower eyelid: Secondary | ICD-10-CM | POA: Diagnosis not present

## 2024-04-02 DIAGNOSIS — H02883 Meibomian gland dysfunction of right eye, unspecified eyelid: Secondary | ICD-10-CM | POA: Diagnosis not present

## 2024-04-02 DIAGNOSIS — H02886 Meibomian gland dysfunction of left eye, unspecified eyelid: Secondary | ICD-10-CM | POA: Diagnosis not present

## 2024-04-16 DIAGNOSIS — H02883 Meibomian gland dysfunction of right eye, unspecified eyelid: Secondary | ICD-10-CM | POA: Diagnosis not present

## 2024-04-16 DIAGNOSIS — H02886 Meibomian gland dysfunction of left eye, unspecified eyelid: Secondary | ICD-10-CM | POA: Diagnosis not present

## 2024-04-16 DIAGNOSIS — H00015 Hordeolum externum left lower eyelid: Secondary | ICD-10-CM | POA: Diagnosis not present

## 2024-05-07 DIAGNOSIS — H02883 Meibomian gland dysfunction of right eye, unspecified eyelid: Secondary | ICD-10-CM | POA: Diagnosis not present

## 2024-05-07 DIAGNOSIS — H02886 Meibomian gland dysfunction of left eye, unspecified eyelid: Secondary | ICD-10-CM | POA: Diagnosis not present

## 2024-05-07 DIAGNOSIS — H00015 Hordeolum externum left lower eyelid: Secondary | ICD-10-CM | POA: Diagnosis not present

## 2024-06-27 DIAGNOSIS — M5416 Radiculopathy, lumbar region: Secondary | ICD-10-CM | POA: Diagnosis not present
